# Patient Record
Sex: Female | Born: 1975
Health system: Southern US, Community
[De-identification: ages and names within clinical notes are randomized; demographics above are authoritative.]

## PROBLEM LIST (undated history)

## (undated) DIAGNOSIS — E119 Type 2 diabetes mellitus without complications: Secondary | ICD-10-CM

## (undated) DIAGNOSIS — G834 Cauda equina syndrome: Secondary | ICD-10-CM

## (undated) DIAGNOSIS — T83021A Displacement of indwelling urethral catheter, initial encounter: Secondary | ICD-10-CM

## (undated) HISTORY — PX: NO PAST SURGERIES: SHX2092

---

## 1999-06-14 ENCOUNTER — Ambulatory Visit (HOSPITAL_COMMUNITY): Admission: RE | Admit: 1999-06-14 | Discharge: 1999-06-14 | Payer: Self-pay | Admitting: *Deleted

## 1999-08-14 ENCOUNTER — Ambulatory Visit (HOSPITAL_COMMUNITY): Admission: RE | Admit: 1999-08-14 | Discharge: 1999-08-14 | Payer: Self-pay | Admitting: Obstetrics

## 1999-11-14 ENCOUNTER — Inpatient Hospital Stay (HOSPITAL_COMMUNITY): Admission: AD | Admit: 1999-11-14 | Discharge: 1999-11-14 | Payer: Self-pay | Admitting: *Deleted

## 1999-11-21 ENCOUNTER — Encounter (HOSPITAL_COMMUNITY): Admission: RE | Admit: 1999-11-21 | Discharge: 1999-12-18 | Payer: Self-pay | Admitting: Obstetrics & Gynecology

## 1999-12-17 ENCOUNTER — Encounter (INDEPENDENT_AMBULATORY_CARE_PROVIDER_SITE_OTHER): Payer: Self-pay

## 1999-12-17 ENCOUNTER — Inpatient Hospital Stay (HOSPITAL_COMMUNITY): Admission: AD | Admit: 1999-12-17 | Discharge: 1999-12-17 | Payer: Self-pay | Admitting: Obstetrics & Gynecology

## 1999-12-17 ENCOUNTER — Inpatient Hospital Stay (HOSPITAL_COMMUNITY): Admission: AD | Admit: 1999-12-17 | Discharge: 1999-12-19 | Payer: Self-pay | Admitting: *Deleted

## 1999-12-17 ENCOUNTER — Encounter: Payer: Self-pay | Admitting: *Deleted

## 1999-12-20 ENCOUNTER — Encounter: Admission: RE | Admit: 1999-12-20 | Discharge: 2000-03-19 | Payer: Self-pay | Admitting: *Deleted

## 2000-05-15 ENCOUNTER — Emergency Department (HOSPITAL_COMMUNITY): Admission: EM | Admit: 2000-05-15 | Discharge: 2000-05-15 | Payer: Self-pay | Admitting: Emergency Medicine

## 2000-11-30 ENCOUNTER — Emergency Department (HOSPITAL_COMMUNITY): Admission: EM | Admit: 2000-11-30 | Discharge: 2000-11-30 | Payer: Self-pay | Admitting: Emergency Medicine

## 2001-04-15 ENCOUNTER — Emergency Department (HOSPITAL_COMMUNITY): Admission: EM | Admit: 2001-04-15 | Discharge: 2001-04-15 | Payer: Self-pay | Admitting: Emergency Medicine

## 2002-05-25 ENCOUNTER — Ambulatory Visit (HOSPITAL_COMMUNITY): Admission: RE | Admit: 2002-05-25 | Discharge: 2002-05-25 | Payer: Self-pay | Admitting: Otolaryngology

## 2002-05-25 ENCOUNTER — Encounter: Payer: Self-pay | Admitting: Otolaryngology

## 2005-07-28 ENCOUNTER — Emergency Department (HOSPITAL_COMMUNITY): Admission: EM | Admit: 2005-07-28 | Discharge: 2005-07-28 | Payer: Self-pay | Admitting: Emergency Medicine

## 2008-01-14 ENCOUNTER — Emergency Department (HOSPITAL_COMMUNITY): Admission: EM | Admit: 2008-01-14 | Discharge: 2008-01-14 | Payer: Self-pay | Admitting: Emergency Medicine

## 2008-01-27 ENCOUNTER — Emergency Department (HOSPITAL_COMMUNITY): Admission: EM | Admit: 2008-01-27 | Discharge: 2008-01-27 | Payer: Self-pay | Admitting: Family Medicine

## 2009-12-27 ENCOUNTER — Emergency Department (HOSPITAL_COMMUNITY): Admission: EM | Admit: 2009-12-27 | Discharge: 2009-12-27 | Payer: Self-pay | Admitting: Emergency Medicine

## 2010-07-02 ENCOUNTER — Encounter: Admission: RE | Admit: 2010-07-02 | Discharge: 2010-07-10 | Payer: Self-pay | Admitting: Orthopedic Surgery

## 2015-05-22 ENCOUNTER — Other Ambulatory Visit: Payer: Self-pay | Admitting: Internal Medicine

## 2015-05-22 DIAGNOSIS — N921 Excessive and frequent menstruation with irregular cycle: Secondary | ICD-10-CM

## 2015-05-24 ENCOUNTER — Ambulatory Visit
Admission: RE | Admit: 2015-05-24 | Discharge: 2015-05-24 | Disposition: A | Discharge status: Home / Self Care | Payer: Medicaid Other | Source: Ambulatory Visit | Attending: Internal Medicine | Admitting: Internal Medicine

## 2015-05-24 ENCOUNTER — Encounter (INDEPENDENT_AMBULATORY_CARE_PROVIDER_SITE_OTHER): Payer: Self-pay

## 2015-05-24 DIAGNOSIS — N921 Excessive and frequent menstruation with irregular cycle: Secondary | ICD-10-CM

## 2015-06-04 ENCOUNTER — Encounter: Payer: Self-pay | Admitting: Obstetrics & Gynecology

## 2015-06-21 ENCOUNTER — Encounter: Payer: Medicaid Other | Admitting: Obstetrics and Gynecology

## 2015-12-08 ENCOUNTER — Emergency Department (INDEPENDENT_AMBULATORY_CARE_PROVIDER_SITE_OTHER)
Admission: EM | Admit: 2015-12-08 | Discharge: 2015-12-08 | Disposition: A | Discharge status: Home / Self Care | Payer: Medicaid Other | Source: Home / Self Care | Attending: Internal Medicine | Admitting: Internal Medicine

## 2015-12-08 ENCOUNTER — Encounter (HOSPITAL_COMMUNITY): Payer: Self-pay | Admitting: *Deleted

## 2015-12-08 DIAGNOSIS — M791 Myalgia: Secondary | ICD-10-CM | POA: Diagnosis not present

## 2015-12-08 DIAGNOSIS — R52 Pain, unspecified: Secondary | ICD-10-CM

## 2015-12-08 DIAGNOSIS — M7918 Myalgia, other site: Secondary | ICD-10-CM

## 2015-12-08 HISTORY — DX: Type 2 diabetes mellitus without complications: E11.9

## 2015-12-08 MED ORDER — NAPROXEN 500 MG PO TABS: 500.0000 mg | ORAL_TABLET | Freq: Two times a day (BID) | ORAL | Status: DC

## 2015-12-08 MED ORDER — CYCLOBENZAPRINE HCL 5 MG PO TABS: 5.0000 mg | ORAL_TABLET | Freq: Three times a day (TID) | ORAL | Status: DC | PRN
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
## 2015-12-08 NOTE — Discharge Instructions (Signed)
Prescriptions for naproxen (anti inflammatory) and flexeril (muscle relaxer) were sent to the [facility] on [address]. Recheck if not starting to improve in a few days.  Pain and stiffness in low back may take several days or longer to resolve. Ice may be helpful for pain/stiffness now; after the first 2 days, use ice or heat, whichever feels better. Physical therapy can be helpful when pain and stiffness do not resolve quickly.

## 2018-02-27 ENCOUNTER — Ambulatory Visit (HOSPITAL_COMMUNITY)
Admission: EM | Admit: 2018-02-27 | Discharge: 2018-02-27 | Disposition: A | Discharge status: Home / Self Care | Payer: Self-pay | Attending: Family Medicine | Admitting: Family Medicine

## 2018-02-27 ENCOUNTER — Other Ambulatory Visit: Payer: Self-pay

## 2018-02-27 ENCOUNTER — Encounter (HOSPITAL_COMMUNITY): Payer: Self-pay

## 2018-02-27 ENCOUNTER — Ambulatory Visit (INDEPENDENT_AMBULATORY_CARE_PROVIDER_SITE_OTHER): Payer: Self-pay

## 2018-02-27 DIAGNOSIS — R1030 Lower abdominal pain, unspecified: Secondary | ICD-10-CM

## 2018-02-27 DIAGNOSIS — R109 Unspecified abdominal pain: Secondary | ICD-10-CM

## 2018-02-27 DIAGNOSIS — Z3202 Encounter for pregnancy test, result negative: Secondary | ICD-10-CM

## 2018-02-27 LAB — POCT URINALYSIS DIP (DEVICE)
GLUCOSE, UA: NEGATIVE mg/dL
Hgb urine dipstick: NEGATIVE
KETONES UR: NEGATIVE mg/dL
Leukocytes, UA: NEGATIVE
Nitrite: NEGATIVE
PROTEIN: NEGATIVE mg/dL
SPECIFIC GRAVITY, URINE: 1.02 (ref 1.005–1.030)
UROBILINOGEN UA: 0.2 mg/dL (ref 0.0–1.0)
pH: 7 (ref 5.0–8.0)

## 2018-02-27 LAB — POCT PREGNANCY, URINE: Preg Test, Ur: NEGATIVE

## 2018-02-27 NOTE — ED Notes (Signed)
Updated on wait. 

## 2018-02-27 NOTE — ED Provider Notes (Signed)
[facility]    CSN: [id number] Arrival date & time: 02/27/18  1541     History   Chief Complaint Chief Complaint  Patient presents with  . Abdominal Pain    HPI Danielle Moreno is a 42 y.o. female.   Patient awoke this morning with abdominal pain.  There is been no nausea vomiting diarrhea or constipation.  Pain is described as lower abdomen and steady. HPI  Past Medical History:  Diagnosis Date  . Diabetes mellitus without complication (HCC)     There are no active problems to display for this patient.   History reviewed. No pertinent surgical history.  OB History   None      Home Medications    Prior to Admission medications   Medication Sig Start Date End Date Taking? Authorizing Provider  METFORMIN HCL PO Take by mouth.   Yes [provider]  naproxen (NAPROSYN) 500 MG tablet Take 1 tablet (500 mg total) by mouth 2 (two) times daily. 12/08/15  Yes Isa Rankin, MD  cyclobenzaprine (FLEXERIL) 5 MG tablet Take 1 tablet (5 mg total) by mouth 3 (three) times daily as needed for muscle spasms. 12/08/15   Isa Rankin, MD    Family History Family History  Problem Relation Age of Onset  . Diabetes Mother   . Healthy Father     Social History Social History   Tobacco Use  . Smoking status: Current Every Day Smoker  . Smokeless tobacco: Never Used  Substance Use Topics  . Alcohol use: No  . Drug use: Not on file     Allergies   Patient has no known allergies.   Review of Systems Review of Systems  Constitutional: Negative.   HENT: Negative.   Respiratory: Negative.   Cardiovascular: Negative.   Gastrointestinal: Positive for abdominal pain.  Genitourinary: Negative.   Neurological: Negative.   Psychiatric/Behavioral: Negative.      Physical Exam Triage Vital Signs ED Triage Vitals [02/27/18 1557]  Enc Vitals Group     BP (!) 156/101     Pulse Rate 92     Resp 18     Temp 99.1 F (37.3 C)   Temp src      SpO2 100 %     Weight 251 lb (113.9 kg)     Height      Head Circumference      Peak Flow      Pain Score 10     Pain Loc      Pain Edu?      Excl. in GC?    No data found.  Updated Vital Signs BP (!) 156/101   Pulse 92   Temp 99.1 F (37.3 C)   Resp 18   Wt 251 lb (113.9 kg)   LMP 01/27/2018   SpO2 100%   Visual Acuity Right Eye Distance:   Left Eye Distance:   Bilateral Distance:    Right Eye Near:   Left Eye Near:    Bilateral Near:     Physical Exam  Constitutional: She appears well-developed and well-nourished.  Cardiovascular: Normal rate and regular rhythm.  Pulmonary/Chest: Effort normal and breath sounds normal.  Abdominal: Bowel sounds are decreased. There is no splenomegaly or hepatomegaly. There is tenderness. There is no rigidity, no rebound, no guarding and no CVA tenderness.     UC Treatments / Results  Labs (all labs ordered are listed, but only abnormal results are displayed) Labs Reviewed  POCT URINALYSIS  DIP (DEVICE) - Abnormal; Notable for the following components:      Result Value   Bilirubin Urine MODERATE (*)    All other components within normal limits  POCT PREGNANCY, URINE    EKG None Radiology No results found.  Procedures Procedures (including critical care time)  Medications Ordered in UC Medications - No data to display   Initial Impression / Assessment and Plan / UC Course  I have reviewed the triage vital signs and the nursing notes.  Pertinent labs & imaging results that were available during my care of the patient were reviewed by me and considered in my medical decision making (see chart for details).     Nonspecific abdominal pain.  Abdominal x-ray shows large amounts of stool throughout the colon.  I believe if we could get her bowel cleaned out that her pain will improve we will begin a MiraLAX purge.  Instructions given  Final Clinical Impressions(s) / UC Diagnoses   Final diagnoses:  None     ED Discharge Orders    None       Controlled Substance Prescriptions [place] Controlled Substance Registry consulted? No   Frederica Kuster, MD 02/27/18 [phone]

## 2018-02-27 NOTE — ED Triage Notes (Signed)
Pt reports pain in her lower stomach radiating to her back. Denies any urinary symptoms.

## 2018-02-28 ENCOUNTER — Telehealth (HOSPITAL_COMMUNITY): Payer: Self-pay | Admitting: Family Medicine

## 2018-02-28 NOTE — Telephone Encounter (Signed)
Provided work note

## 2018-10-03 ENCOUNTER — Ambulatory Visit (HOSPITAL_COMMUNITY)
Admission: EM | Admit: 2018-10-03 | Discharge: 2018-10-03 | Disposition: A | Discharge status: Home / Self Care | Payer: Medicaid Other | Attending: Family Medicine | Admitting: Family Medicine

## 2018-10-03 ENCOUNTER — Encounter (HOSPITAL_COMMUNITY): Payer: Self-pay | Admitting: Emergency Medicine

## 2018-10-03 ENCOUNTER — Other Ambulatory Visit: Payer: Self-pay

## 2018-10-03 DIAGNOSIS — M5431 Sciatica, right side: Secondary | ICD-10-CM

## 2018-10-03 MED ORDER — HYDROCODONE-ACETAMINOPHEN 5-325 MG PO TABS: 1.0000 | ORAL_TABLET | Freq: Four times a day (QID) | ORAL | 0 refills | Status: DC | PRN

## 2018-10-03 MED ORDER — PREDNISONE 20 MG PO TABS: ORAL_TABLET | ORAL | 0 refills | Status: DC

## 2018-10-03 MED ORDER — METFORMIN HCL 500 MG PO TABS: 500.0000 mg | ORAL_TABLET | Freq: Two times a day (BID) | ORAL | 2 refills | Status: DC

## 2018-10-03 NOTE — ED Triage Notes (Signed)
Lower back pain for 2 weeks.  Right hip started hurting one week ago.  Denies fall.

## 2018-10-03 NOTE — ED Provider Notes (Signed)
[facility]    CSN: [id number] Arrival date & time: 10/03/18  1107     History   Chief Complaint Chief Complaint  Patient presents with  . Back Pain    HPI Danielle Moreno is a 42 y.o. female.   Lower back pain for 2 weeks.  Right hip started hurting one week ago.  Denies fall.    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  She has no fever, no weakness in the leg, no numbness in the leg.  Back pain is on the right side and radiates down as far as the right knee     Past Medical History:  Diagnosis Date  . Diabetes mellitus without complication (HCC)     There are no active problems to display for this patient.   History reviewed. No pertinent surgical history.  OB History   None      Home Medications    Prior to Admission medications   Medication Sig Start Date End Date Taking? Authorizing Provider  ibuprofen (ADVIL,MOTRIN) 800 MG tablet Take 800 mg by mouth every 8 (eight) hours as needed.   Yes [provider]  NON FORMULARY    Yes [provider]  HYDROcodone-acetaminophen (NORCO) 5-325 MG tablet Take 1 tablet by mouth every 6 (six) hours as needed for moderate pain. 10/03/18   Elvina Sidle, MD  metFORMIN (GLUCOPHAGE) 500 MG tablet Take 1 tablet (500 mg total) by mouth 2 (two) times daily with a meal. 10/03/18   Elvina Sidle, MD  predniSONE (DELTASONE) 20 MG tablet Two daily with food 10/03/18   Elvina Sidle, MD    Family History Family History  Problem Relation Age of Onset  . Diabetes Mother   . Healthy Father     Social History Social History   Tobacco Use  . Smoking status: Current Every Day Smoker  . Smokeless tobacco: Never Used  Substance Use Topics  . Alcohol use: No  . Drug use: Never     Allergies   Patient has no known allergies.   Review of Systems Review of Systems  Musculoskeletal: Positive for back pain.  All other  systems reviewed and are negative.    Physical Exam Triage Vital Signs ED Triage Vitals  Enc Vitals Group     BP 10/03/18 1143 (!) 166/83     Pulse Rate 10/03/18 1143 80     Resp 10/03/18 1143 18     Temp 10/03/18 1143 97.8 F (36.6 C)     Temp Source 10/03/18 1143 Oral     SpO2 10/03/18 1143 100 %     Weight --      Height --      Head Circumference --      Peak Flow --      Pain Score 10/03/18 1139 10     Pain Loc --      Pain Edu? --      Excl. in GC? --    No data found.  Updated Vital Signs BP (!) 166/83 (BP Location: Right Arm) Comment (BP Location): large cuff  Pulse 80   Temp 97.8 F (36.6 C) (Oral)   Resp 18   LMP 10/01/2018   SpO2 100%    Physical Exam  Constitutional: She appears well-developed and well-nourished.  HENT:  Right Ear: External ear normal.  Left Ear: External ear normal.  Eyes: Conjunctivae are normal.  Neck: Normal range of  motion. Neck supple.  Pulmonary/Chest: Effort normal.  Musculoskeletal:  Positive straight leg raising on the right  Palpation of the right back and right hip areas are tender.  Neurological: She is alert.  Skin: Skin is warm and dry.  Psychiatric: She has a normal mood and affect.  Nursing note and vitals reviewed.    UC Treatments / Results  Labs (all labs ordered are listed, but only abnormal results are displayed) Labs Reviewed - No data to display  EKG None  Radiology No results found.  Procedures Procedures (including critical care time)  Medications Ordered in UC Medications - No data to display  Initial Impression / Assessment and Plan / UC Course  I have reviewed the triage vital signs and the nursing notes.  Pertinent labs & imaging results that were available during my care of the patient were reviewed by me and considered in my medical decision making (see chart for details).    Final Clinical Impressions(s) / UC Diagnoses   Final diagnoses:  Sciatica of right side      Discharge Instructions     If your pain is not improving in the next 2 days, please return for reevaluation.    ED Prescriptions    Medication Sig Dispense Auth. Provider   metFORMIN (GLUCOPHAGE) 500 MG tablet Take 1 tablet (500 mg total) by mouth 2 (two) times daily with a meal. 60 tablet Elvina Sidle, MD   predniSONE (DELTASONE) 20 MG tablet Two daily with food 10 tablet Elvina Sidle, MD   HYDROcodone-acetaminophen (NORCO) 5-325 MG tablet Take 1 tablet by mouth every 6 (six) hours as needed for moderate pain. 12 tablet Elvina Sidle, MD     Controlled Substance Prescriptions [place] Controlled Substance Registry consulted? Not Applicable   Elvina Sidle, MD 10/03/18 1203

## 2018-10-03 NOTE — Discharge Instructions (Addendum)
If your pain is not improving in the next 2 days, please return for reevaluation.

## 2018-10-05 ENCOUNTER — Encounter (HOSPITAL_COMMUNITY): Payer: Self-pay | Admitting: Emergency Medicine

## 2018-10-05 ENCOUNTER — Ambulatory Visit (HOSPITAL_COMMUNITY)
Admission: EM | Admit: 2018-10-05 | Discharge: 2018-10-05 | Disposition: A | Discharge status: Home / Self Care | Payer: Self-pay | Attending: Family Medicine | Admitting: Family Medicine

## 2018-10-05 DIAGNOSIS — M5441 Lumbago with sciatica, right side: Secondary | ICD-10-CM

## 2018-10-05 MED ORDER — CYCLOBENZAPRINE HCL 5 MG PO TABS: 10.0000 mg | ORAL_TABLET | Freq: Every day | ORAL | 0 refills | Status: DC

## 2018-10-05 NOTE — ED Provider Notes (Signed)
[facility]   [id number] 10/05/18 Arrival Time: 6283  CC: Back PAIN  SUBJECTIVE: History from: patient. Danielle Moreno is a 42 y.o. female complains of right sided low back pain that began 2 weeks ago.  Denies a precipitating event or specific injury.  Localizes the pain to the low back.  Describes the pain as constant and "20"/10.  Was seen at [facility] 2 days ago and treated with norco and prednisone.  Reports minimal relief with treatment.  Symptoms are made worse with standing.  Denies similar symptoms in the past.  Complains of numbness in right leg.  Denies fever, chills, erythema, ecchymosis, effusion, weakness, saddle paresthesias, or loss of bowel or bladder habits.   ROS: As per HPI.  Past Medical History:  Diagnosis Date  . Diabetes mellitus without complication (HCC)    History reviewed. No pertinent surgical history. No Known Allergies No current facility-administered medications on file prior to encounter.    Current Outpatient Medications on File Prior to Encounter  Medication Sig Dispense Refill  . HYDROcodone-acetaminophen (NORCO) 5-325 MG tablet Take 1 tablet by mouth every 6 (six) hours as needed for moderate pain. 12 tablet 0  . ibuprofen (ADVIL,MOTRIN) 800 MG tablet Take 800 mg by mouth every 8 (eight) hours as needed.    . metFORMIN (GLUCOPHAGE) 500 MG tablet Take 1 tablet (500 mg total) by mouth 2 (two) times daily with a meal. 60 tablet 2  . NON FORMULARY     . predniSONE (DELTASONE) 20 MG tablet Two daily with food 10 tablet 0   Social History   Socioeconomic History  . Marital status: Single    Spouse name: Not on file  . Number of children: Not on file  . Years of education: Not on file  . Highest education level: Not on file  Occupational History  . Not on file  Social Needs  . Financial resource strain: Not on file  . Food insecurity:    Worry: Not on file    Inability: Not on file  . Transportation needs:    Medical: Not on file   Non-medical: Not on file  Tobacco Use  . Smoking status: Current Every Day Smoker  . Smokeless tobacco: Never Used  Substance and Sexual Activity  . Alcohol use: No  . Drug use: Never  . Sexual activity: Not on file  Lifestyle  . Physical activity:    Days per week: Not on file    Minutes per session: Not on file  . Stress: Not on file  Relationships  . Social connections:    Talks on phone: Not on file    Gets together: Not on file    Attends religious service: Not on file    Active member of club or organization: Not on file    Attends meetings of clubs or organizations: Not on file    Relationship status: Not on file  . Intimate partner violence:    Fear of current or ex partner: Not on file    Emotionally abused: Not on file    Physically abused: Not on file    Forced sexual activity: Not on file  Other Topics Concern  . Not on file  Social History Narrative  . Not on file   Family History  Problem Relation Age of Onset  . Diabetes Mother   . Healthy Father     OBJECTIVE:  Vitals:   10/05/18 1058  BP: (!) 152/94  Pulse: 79  Resp: 18  Temp: 97.7 F (36.5 C)  TempSrc: Oral  SpO2: 100%    General appearance: AOx3; in no acute distress.  Head: NCAT Lungs: CTA bilaterally Heart: RRR.  Clear S1 and S2 without murmur, gallops, or rubs.  Radial pulses 2+ bilaterally. Musculoskeletal: Back Inspection: Skin warm, dry, clear and intact without obvious erythema, effusion, or ecchymosis.  Palpation: Diffusely tender to palpation about the low back; and bilateral lower paravertebral muscles.   ROM: LROM Strength: 5/5 shld abduction, 5/5 shld adduction, 5/5 elbow flexion, 5/5 elbow extension, 5/5 grip strength, 5/5 hip flexion, 5/5 knee abduction, 5/5 knee adduction, 5/5 knee flexion, 5/5 knee extension, 5/5 dorsiflexion, 5/5 plantar flexion - SLR Skin: warm and dry Neurologic: Ambulates with minimal difficulty; Sensation intact about the upper/ lower  extremities Psychological: alert and cooperative; normal mood and affect  ASSESSMENT & PLAN:  1. Acute bilateral low back pain with right-sided sciatica    Meds ordered this encounter  Medications  . cyclobenzaprine (FLEXERIL) 5 MG tablet    Sig: Take 2 tablets (10 mg total) by mouth at bedtime.    Dispense:  12 tablet    Refill:  0    Order Specific Question:   Supervising Provider    Answer:   Isa Rankin [[id number]]   Continue conservative management of rest, ice, heat, and gentle stretches Continue with prednisone as prescribed.   Take cyclobenzaprine at nighttime for symptomatic relief. Avoid driving or operating heavy machinery while using medication. Follow up with orthopedist for further evaluation and management of low back pain Return or go to the ER if you have any new or worsening symptoms (fever, chills, chest pain, abdominal pain, changes in bowel or bladder habits, pain radiating into lower legs, etc...)   Reviewed expectations re: course of current medical issues. Questions answered. Outlined signs and symptoms indicating need for more acute intervention. Patient verbalized understanding. After Visit Summary given.    Rennis Harding, PA-C 10/05/18 1351

## 2018-10-05 NOTE — Discharge Instructions (Signed)
Continue conservative management of rest, ice, heat, and gentle stretches Continue with prednisone as prescribed.   Take cyclobenzaprine at nighttime for symptomatic relief. Avoid driving or operating heavy machinery while using medication. Follow up with orthopedist for further evaluation and management of low back pain Return or go to the ER if you have any new or worsening symptoms (fever, chills, chest pain, abdominal pain, changes in bowel or bladder habits, pain radiating into lower legs, etc...)

## 2018-10-05 NOTE — ED Triage Notes (Signed)
Pt here for right sided back and hip pain; pt seen here recently for same

## 2018-10-07 ENCOUNTER — Emergency Department (HOSPITAL_COMMUNITY)
Admission: EM | Admit: 2018-10-07 | Discharge: 2018-10-07 | Disposition: A | Discharge status: Home / Self Care | Payer: Medicaid Other | Attending: Emergency Medicine | Admitting: Emergency Medicine

## 2018-10-07 ENCOUNTER — Encounter (HOSPITAL_COMMUNITY): Payer: Self-pay

## 2018-10-07 DIAGNOSIS — F17210 Nicotine dependence, cigarettes, uncomplicated: Secondary | ICD-10-CM | POA: Insufficient documentation

## 2018-10-07 DIAGNOSIS — Z79899 Other long term (current) drug therapy: Secondary | ICD-10-CM | POA: Insufficient documentation

## 2018-10-07 DIAGNOSIS — M5431 Sciatica, right side: Secondary | ICD-10-CM | POA: Insufficient documentation

## 2018-10-07 DIAGNOSIS — E119 Type 2 diabetes mellitus without complications: Secondary | ICD-10-CM | POA: Insufficient documentation

## 2018-10-07 MED ORDER — IBUPROFEN 800 MG PO TABS
800.0000 mg | ORAL_TABLET | Freq: Once | ORAL | Status: AC
  Administered 2018-10-07: 800 mg via ORAL
  Filled 2018-10-07: qty 1

## 2018-10-07 MED ORDER — IBUPROFEN 800 MG PO TABS: 800.0000 mg | ORAL_TABLET | Freq: Three times a day (TID) | ORAL | 0 refills | Status: DC | PRN

## 2018-10-07 NOTE — ED Triage Notes (Signed)
Pt reports back pain that extends down into right leg. Pt reports that she has been seen at [facility] for the same. Pt reports tenderness in the rt buttocks. Pt reports trying Motrin OTC with no relief.

## 2018-10-07 NOTE — ED Provider Notes (Signed)
[facility]-EMERGENCY DEPT Provider Note   CSN: [id number] Arrival date & time: 10/07/18  8453     History   Chief Complaint Chief Complaint  Patient presents with  . Back Pain  . Hip Pain    HPI Danielle Moreno is a 42 y.o. female.  The history is provided by the patient.  Back Pain   This is a new problem. The current episode started more than 1 week ago. The problem occurs daily. The problem has not changed since onset.The pain is associated with no known injury. The pain is present in the lumbar spine and sacro-iliac joint. The quality of the pain is described as aching. The pain does not radiate. The pain is at a severity of 4/10. The pain is moderate. The symptoms are aggravated by twisting and certain positions. Pertinent negatives include no chest pain, no fever, no numbness, no weight loss, no headaches, no abdominal pain, no abdominal swelling, no bowel incontinence, no perianal numbness, no bladder incontinence, no dysuria, no pelvic pain, no leg pain, no paresthesias, no paresis and no tingling. She has tried NSAIDs, muscle relaxants and analgesics for the symptoms. The treatment provided mild relief.    Past Medical History:  Diagnosis Date  . Diabetes mellitus without complication (HCC)     There are no active problems to display for this patient.   History reviewed. No pertinent surgical history.   OB History   None      Home Medications    Prior to Admission medications   Medication Sig Start Date End Date Taking? Authorizing Provider  cyclobenzaprine (FLEXERIL) 5 MG tablet Take 2 tablets (10 mg total) by mouth at bedtime. 10/05/18   Wurst, [place], PA-C  HYDROcodone-acetaminophen (NORCO) 5-325 MG tablet Take 1 tablet by mouth every 6 (six) hours as needed for moderate pain. 10/03/18   Elvina Sidle, MD  ibuprofen (ADVIL,MOTRIN) 800 MG tablet Take 1 tablet (800 mg total) by mouth every 8 (eight) hours as needed for up to 30  doses. 10/07/18   Shakendra Griffeth, DO  metFORMIN (GLUCOPHAGE) 500 MG tablet Take 1 tablet (500 mg total) by mouth 2 (two) times daily with a meal. 10/03/18   Elvina Sidle, MD  NON FORMULARY     [provider]  predniSONE (DELTASONE) 20 MG tablet Two daily with food 10/03/18   Elvina Sidle, MD    Family History Family History  Problem Relation Age of Onset  . Diabetes Mother   . Healthy Father     Social History Social History   Tobacco Use  . Smoking status: Current Every Day Smoker  . Smokeless tobacco: Never Used  Substance Use Topics  . Alcohol use: No  . Drug use: Never     Allergies   Patient has no known allergies.   Review of Systems Review of Systems  Constitutional: Negative for chills, fever and weight loss.  HENT: Negative for ear pain and sore throat.   Eyes: Negative for pain and visual disturbance.  Respiratory: Negative for cough and shortness of breath.   Cardiovascular: Negative for chest pain and palpitations.  Gastrointestinal: Negative for abdominal pain, bowel incontinence and vomiting.  Genitourinary: Negative for bladder incontinence, dysuria, hematuria and pelvic pain.  Musculoskeletal: Positive for arthralgias and back pain. Negative for joint swelling, myalgias, neck pain and neck stiffness.  Skin: Negative for color change and rash.  Neurological: Negative for tingling, seizures, syncope, numbness, headaches and paresthesias.  All other systems reviewed and are  negative.    Physical Exam Updated Vital Signs  ED Triage Vitals [10/07/18 0953]  Enc Vitals Group     BP (!) 160/128     Pulse Rate 89     Resp 16     Temp 98.3 F (36.8 C)     Temp Source Oral     SpO2 99 %     Weight 250 lb (113.4 kg)     Height 5\' 8"  (1.727 m)     Head Circumference      Peak Flow      Pain Score      Pain Loc      Pain Edu?      Excl. in GC?     Physical Exam  Constitutional: She is oriented to person, place, and time. She  appears well-developed and well-nourished. No distress.  HENT:  Head: Normocephalic and atraumatic.  Eyes: Pupils are equal, round, and reactive to light. Conjunctivae and EOM are normal.  Neck: Neck supple.  Cardiovascular: Normal rate, regular rhythm, normal heart sounds and intact distal pulses.  No murmur heard. Pulmonary/Chest: Effort normal and breath sounds normal. No respiratory distress.  Abdominal: Soft. There is no tenderness.  Musculoskeletal: Normal range of motion. She exhibits tenderness (TTP to right piriformis muscle). She exhibits no edema.  No midline spinal pain   Neurological: She is alert and oriented to person, place, and time. No cranial nerve deficit or sensory deficit. She exhibits normal muscle tone. Coordination normal.  5+/5 strength, normal sensation, normal gait  Skin: Skin is warm and dry. Capillary refill takes less than 2 seconds.  Psychiatric: She has a normal mood and affect.  Nursing note and vitals reviewed.    ED Treatments / Results  Labs (all labs ordered are listed, but only abnormal results are displayed) Labs Reviewed - No data to display  EKG None  Radiology No results found.  Procedures Procedures (including critical care time)  Medications Ordered in ED Medications  ibuprofen (ADVIL,MOTRIN) tablet 800 mg (has no administration in time range)     Initial Impression / Assessment and Plan / ED Course  I have reviewed the triage vital signs and the nursing notes.  Pertinent labs & imaging results that were available during my care of the patient were reviewed by me and considered in my medical decision making (see chart for details).     Danielle Moreno is a 42 year old female history of diabetes who presents to the ED with right-sided lower back pain.  Patient with normal vitals.  Patient seen by urgent care several days ago was started on narcotic pain medicine and prednisone without much relief.  Patient has manual labor  job.  Denies any trauma.  Patient has no symptoms suggestive of cauda equina.  No loss of bowel or bladder.  No weakness.  Pain is mostly in the right gluteal area with shooting pain down the leg.  She is tender in the right piriformis area on exam.  Normal neurological exam.  No midline spinal tenderness.  History and physical is consistent with sciatica.  Recommend anti-inflammatory treatment with Motrin.  Recommend discontinuing narcotic pain medicine.  Given home exercises and recommend close follow-up with primary care doctor.  May need physical therapy.  Discharged from ED in good condition given return precautions.  This chart was dictated using voice recognition software.  Despite best efforts to proofread,  errors can occur which can change the documentation meaning.   Final Clinical Impressions(s) / ED  Diagnoses   Final diagnoses:  Sciatica of right side    ED Discharge Orders         Ordered    ibuprofen (ADVIL,MOTRIN) 800 MG tablet  Every 8 hours PRN     10/07/18 0951           Virgina Norfolk, DO 10/07/18 [phone]

## 2018-10-07 NOTE — ED Notes (Signed)
Bed: [facility] Expected date:  Expected time:  Means of arrival:  Comments: No bed 

## 2018-10-13 NOTE — Progress Notes (Signed)
Patient ID: Danielle Moreno, female   DOB: 07-15-1976, 42 y.o.   MRN: [id number]      Danielle Moreno, is a 42 y.o. female  XTA:[id number]  [id number]  DOB - 1976/11/03  Subjective:  Chief Complaint and HPI: Danielle Moreno is a 42 y.o. female here today to establish care and for a follow up visit After being seen in the ED 10/07/2018 for back pain.  Pain has not improved.  She has had prednisone, high dose ibuprofen, flexeril, and even opiates w/o pain relief.  Pain started acutely about 3 weeks ago.  NKI.  Pain is in mi lower back and radiates into R leg.  No paresthesias or weakness.  No urinary s/sx.  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   From ED note: Back Pain   This is a new problem. The current episode started more than 1 week ago. The problem occurs daily. The problem has not changed since onset.The pain is associated with no known injury. The pain is present in the lumbar spine and sacro-iliac joint. The quality of the pain is described as aching. The pain does not radiate. The pain is at a severity of 4/10. The pain is moderate. The symptoms are aggravated by twisting and certain positions. Pertinent negatives include no chest pain, no fever, no numbness, no weight loss, no headaches, no abdominal pain, no abdominal swelling, no bowel incontinence, no perianal numbness, no bladder incontinence, no dysuria, no pelvic pain, no leg pain, no paresthesias, no paresis and no tingling. She has tried NSAIDs, muscle relaxants and analgesics for the symptoms. The treatment provided mild relief.   From A/P: Danielle Moreno is a 42 year old female history of diabetes who presents to the ED with right-sided lower back pain.  Patient with normal vitals.  Patient seen by urgent care several days ago was started on narcotic pain medicine and prednisone without much relief.  Patient has manual labor job.  Denies any trauma.  Patient has no symptoms suggestive of cauda  equina.  No loss of bowel or bladder.  No weakness.  Pain is mostly in the right gluteal area with shooting pain down the leg.  She is tender in the right piriformis area on exam.  Normal neurological exam.  No midline spinal tenderness.  History and physical is consistent with sciatica.  Recommend anti-inflammatory treatment with Motrin.  Recommend discontinuing narcotic pain medicine.  Given home exercises and recommend close follow-up with primary care doctor.  May need physical therapy.  Discharged from ED in good condition given return precautions.  ED/Hospital notes reviewed.   Social History:  Works in a kitchen  ROS:   Constitutional:  No f/c, No night sweats, No unexplained weight loss. EENT:  No vision changes, No blurry vision, No hearing changes. No mouth, throat, or ear problems.  Respiratory: No cough, No SOB Cardiac: No CP, no palpitations GI:  No abd pain, No N/V/D. GU: No Urinary s/sx Musculoskeletal: + low back and R leg pain Neuro: No headache, no dizziness, no motor weakness.  Skin: No rash Endocrine:  No polydipsia. No polyuria.  Psych: Denies SI/HI  No problems updated.  ALLERGIES: No Known Allergies  PAST MEDICAL HISTORY: Past Medical History:  Diagnosis Date  . Diabetes mellitus without complication (HCC)     MEDICATIONS AT HOME: Prior to Admission medications   Medication Sig Start Date End Date Taking? Authorizing Provider  metFORMIN (GLUCOPHAGE) 500 MG tablet Take 1 tablet (500 mg total) by  mouth 2 (two) times daily with a meal. 10/20/18  Yes Calven Gilkes, Marzella Schlein, PA-C  NON FORMULARY    Yes [provider]  diclofenac sodium (VOLTAREN) 1 % GEL Apply 2 g topically 4 (four) times daily. 10/20/18   Anders Simmonds, PA-C  methocarbamol (ROBAXIN) 500 MG tablet Take 2 tablets (1,000 mg total) by mouth 3 (three) times daily. X 10 days then prn muscle spasm 10/20/18   Georgian Co M, PA-C  naproxen (NAPROSYN) 500 MG tablet Take 1 tablet (500 mg total) by  mouth 2 (two) times daily with a meal. X 10days then prn pain 10/20/18   Anders Simmonds, PA-C     Objective:  EXAM:   Vitals:   10/20/18 1454  BP: (!) 146/90  Pulse: 88  Resp: 18  Temp: 98.7 F (37.1 C)  TempSrc: Oral  SpO2: 99%  Weight: 247 lb (112 kg)  Height: 5\' 8"  (1.727 m)    General appearance : A&OX3. NAD. Non-toxic-appearing HEENT: Atraumatic and Normocephalic.  PERRLA. EOM intact.  Neck: supple, no JVD. No cervical lymphadenopathy. No thyromegaly Chest/Lungs:  Breathing-non-labored, Good air entry bilaterally, breath sounds normal without rales, rhonchi, or wheezing  CVS: S1 S2 regular, no murmurs, gallops, rubs  Back:  ROM ~70% of normal.  Labored movements.  Small limp favoring L side.  Sensory=B.  DTR=B.  Neg SLR B.   Extremities: Bilateral Lower Ext shows no edema, both legs are warm to touch with = pulse throughout Neurology:  CN II-XII grossly intact, Non focal.   Psych:  TP linear. J/I WNL. Normal speech. Appropriate eye contact and affect.  Skin:  No Rash  Data Review Lab Results  Component Value Date   HGBA1C 5.8 10/20/2018     Assessment & Plan   1. Acute right-sided low back pain with right-sided sciatica No red flags but concern due to lack of pain relief- - ketorolac (TORADOL) injection 60 mg - naproxen (NAPROSYN) 500 MG tablet; Take 1 tablet (500 mg total) by mouth 2 (two) times daily with a meal. X 10days then prn pain  Dispense: 60 tablet; Refill: 1 - methocarbamol (ROBAXIN) 500 MG tablet; Take 2 tablets (1,000 mg total) by mouth 3 (three) times daily. X 10 days then prn muscle spasm  Dispense: 90 tablet; Refill: 0 - diclofenac sodium (VOLTAREN) 1 % GEL; Apply 2 g topically 4 (four) times daily.  Dispense: 100 g; Refill: 3 - DG Lumbar Spine Complete; Future -consider MRI?  2. Type 2 diabetes mellitus with hyperglycemia, without long-term current use of insulin (HCC) Controlled-will hold off on labs until January since she will have insurance  as of then.   - Glucose (CBG) - HgB A1c - metFORMIN (GLUCOPHAGE) 500 MG tablet; Take 1 tablet (500 mg total) by mouth 2 (two) times daily with a meal.  Dispense: 60 tablet; Refill: 2  3. Encounter for examination following treatment at hospital No changes.     Patient have been counseled extensively about nutrition and exercise  Return in about 1 month (around 11/20/2018) for assign PCP-recheck back pain.  The patient was given clear instructions to go to ER or return to medical center if symptoms don't improve, worsen or new problems develop. The patient verbalized understanding. The patient was told to call to get lab results if they haven't heard anything in the next week.     Georgian Co, PA-C [facility] [place], [place] [phone]   10/20/2018, 3:50 PM

## 2018-10-20 ENCOUNTER — Ambulatory Visit: Payer: Self-pay | Attending: Family Medicine | Admitting: Physician Assistant

## 2018-10-20 VITALS — BP 146/90 | HR 88 | Temp 98.7°F | Resp 18 | Ht 68.0 in | Wt 247.0 lb

## 2018-10-20 DIAGNOSIS — Z09 Encounter for follow-up examination after completed treatment for conditions other than malignant neoplasm: Secondary | ICD-10-CM

## 2018-10-20 DIAGNOSIS — M5441 Lumbago with sciatica, right side: Secondary | ICD-10-CM | POA: Insufficient documentation

## 2018-10-20 DIAGNOSIS — Z791 Long term (current) use of non-steroidal anti-inflammatories (NSAID): Secondary | ICD-10-CM | POA: Insufficient documentation

## 2018-10-20 DIAGNOSIS — Z7984 Long term (current) use of oral hypoglycemic drugs: Secondary | ICD-10-CM | POA: Insufficient documentation

## 2018-10-20 DIAGNOSIS — E1165 Type 2 diabetes mellitus with hyperglycemia: Secondary | ICD-10-CM | POA: Insufficient documentation

## 2018-10-20 DIAGNOSIS — M47896 Other spondylosis, lumbar region: Secondary | ICD-10-CM | POA: Insufficient documentation

## 2018-10-20 LAB — POCT GLYCOSYLATED HEMOGLOBIN (HGB A1C): HBA1C, POC (PREDIABETIC RANGE): 5.8 % (ref 5.7–6.4)

## 2018-10-20 LAB — GLUCOSE, POCT (MANUAL RESULT ENTRY): POC GLUCOSE: 101 mg/dL — AB (ref 70–99)

## 2018-10-20 MED ORDER — DICLOFENAC SODIUM 1 % TD GEL: 2.0000 g | Freq: Four times a day (QID) | TRANSDERMAL | 3 refills | Status: DC

## 2018-10-20 MED ORDER — NAPROXEN 500 MG PO TABS
500.0000 mg | ORAL_TABLET | Freq: Two times a day (BID) | ORAL | 1 refills | Status: DC
Start: 2018-10-20 — End: 2018-12-31

## 2018-10-20 MED ORDER — KETOROLAC TROMETHAMINE 60 MG/2ML IM SOLN
60.0000 mg | Freq: Once | INTRAMUSCULAR | Status: AC
  Administered 2018-10-20: 60 mg via INTRAMUSCULAR

## 2018-10-20 MED ORDER — METFORMIN HCL 500 MG PO TABS: 500.0000 mg | ORAL_TABLET | Freq: Two times a day (BID) | ORAL | 2 refills | Status: AC

## 2018-10-20 MED ORDER — METHOCARBAMOL 500 MG PO TABS: 1000.0000 mg | ORAL_TABLET | Freq: Three times a day (TID) | ORAL | 0 refills | Status: DC

## 2018-10-20 MED FILL — METHOCARBAMOL 500 MG TABS: 500 | 15 days supply | Qty: 90 | Fill #0

## 2018-10-20 MED FILL — metFORMIN HCL 500 MG TABS: 500 | 30 days supply | Qty: 60 | Fill #0

## 2018-10-20 MED FILL — DICLOFENAC SODIUM 1% GEL: 1 | 12 days supply | Qty: 100 | Fill #0

## 2018-10-20 MED FILL — NAPROXEN 500 MG TABLET: 500 | 30 days supply | Qty: 60 | Fill #0

## 2018-10-21 ENCOUNTER — Ambulatory Visit (HOSPITAL_COMMUNITY)
Admission: RE | Admit: 2018-10-21 | Discharge: 2018-10-21 | Disposition: A | Discharge status: Home / Self Care | Payer: Medicaid Other | Source: Ambulatory Visit | Attending: Physician Assistant | Admitting: Physician Assistant

## 2018-10-21 ENCOUNTER — Encounter (HOSPITAL_COMMUNITY): Payer: Self-pay

## 2018-10-21 DIAGNOSIS — M5441 Lumbago with sciatica, right side: Secondary | ICD-10-CM | POA: Insufficient documentation

## 2018-10-25 ENCOUNTER — Telehealth: Payer: Self-pay | Admitting: *Deleted

## 2018-10-25 NOTE — Telephone Encounter (Signed)
Patient verified DOB Patient is aware of arthritis being noted and no abnormalities. Patient advised to use medications as prescribed and to follow up as planned. No further questions.

## 2018-10-25 NOTE — Telephone Encounter (Signed)
-----   Message from Anders Simmonds, [place] sent at 10/23/2018 10:57 AM EST ----- Please call patient.  Xray showed some arthritis but no other abnormalities.  Follow-up as planned.  Thanks, Georgian Co, PA-C

## 2018-11-22 ENCOUNTER — Encounter: Payer: Self-pay | Admitting: Family Medicine

## 2018-11-22 ENCOUNTER — Ambulatory Visit: Payer: Self-pay | Attending: Family Medicine | Admitting: Family Medicine

## 2018-11-22 VITALS — BP 138/90 | HR 68 | Temp 98.5°F | Resp 18 | Ht 68.0 in | Wt 242.0 lb

## 2018-11-22 DIAGNOSIS — E119 Type 2 diabetes mellitus without complications: Secondary | ICD-10-CM | POA: Insufficient documentation

## 2018-11-22 DIAGNOSIS — Z1239 Encounter for other screening for malignant neoplasm of breast: Secondary | ICD-10-CM

## 2018-11-22 DIAGNOSIS — M47816 Spondylosis without myelopathy or radiculopathy, lumbar region: Secondary | ICD-10-CM | POA: Insufficient documentation

## 2018-11-22 DIAGNOSIS — F17200 Nicotine dependence, unspecified, uncomplicated: Secondary | ICD-10-CM | POA: Insufficient documentation

## 2018-11-22 DIAGNOSIS — Z79899 Other long term (current) drug therapy: Secondary | ICD-10-CM | POA: Insufficient documentation

## 2018-11-22 DIAGNOSIS — Z7984 Long term (current) use of oral hypoglycemic drugs: Secondary | ICD-10-CM | POA: Insufficient documentation

## 2018-11-22 DIAGNOSIS — Z6836 Body mass index (BMI) 36.0-36.9, adult: Secondary | ICD-10-CM

## 2018-11-22 DIAGNOSIS — E669 Obesity, unspecified: Secondary | ICD-10-CM

## 2018-11-22 DIAGNOSIS — Z833 Family history of diabetes mellitus: Secondary | ICD-10-CM | POA: Insufficient documentation

## 2018-11-22 DIAGNOSIS — Z791 Long term (current) use of non-steroidal anti-inflammatories (NSAID): Secondary | ICD-10-CM | POA: Insufficient documentation

## 2018-11-22 LAB — POCT GLYCOSYLATED HEMOGLOBIN (HGB A1C): Hemoglobin A1C: 5.4 % (ref 4.0–5.6)

## 2018-11-22 MED FILL — metFORMIN HCL 500 MG TABS: 500 | 30 days supply | Qty: 60 | Fill #1

## 2018-11-22 MED FILL — NAPROXEN 500 MG TABLET: 500 | 30 days supply | Qty: 60 | Fill #1

## 2018-11-22 NOTE — Progress Notes (Signed)
Subjective:    Patient ID: Danielle Moreno, female    DOB: 04-Mar-1976, 43 y.o.   MRN: [id number]  HPI       43 year old female with diabetes who is status post new patient visit on 10/20/2018 to establish care after an ED visit for back pain with radiation on 10/07/2018.  Patient's most recent hemoglobin A1c was 5.8 on 10/20/2018.  Patient had x-ray of the lumbar spine done on 10/21/2018 which showed diffuse multilevel degenerative changes but no acute bony abnormalities.  Patient reports that she has had improvement in her right-sided low back pain.  Patient is no longer having any radiation of pain down her right leg.  Patient also states that her pain is now about a 3-4 on a 0-to-10 scale in the right mid to low back.  Patient was prescribed naproxen, Robaxin and Voltaren gel at her last visit on review of note.  Patient did have x-ray of her lumbar spine that was ordered at her last visit.  Patient also states that the injection she was given at her last visit (toradol) also helped.  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      Patient reports that she is taking the metformin prescribed for her diabetes.  Patient denies any increased thirst, no urinary frequency and no blurred vision.  Patient denies any numbness or tingling in her hands or feet.  Patient did have some mild stomach queasiness when she initially started metformin but she states that this has now resolved.  Past Medical History:  Diagnosis Date  . Diabetes mellitus without complication [facility])    Past Surgical History:  Procedure Laterality Date  . NO PAST SURGERIES     Family History  Problem Relation Age of Onset  . Diabetes Mother   . Healthy Father    Social History   Tobacco Use  . Smoking status: Current Every Day Smoker  . Smokeless tobacco: Never Used  Substance Use Topics   . Alcohol use: No  . Drug use: Never  No Known Allergies    Review of Systems  Constitutional: Negative for chills, fatigue and fever.  HENT: Negative for hearing loss and trouble swallowing.   Eyes: Negative for photophobia and visual disturbance.       Wears glasses for correction of vision  Respiratory: Negative for cough and shortness of breath.   Cardiovascular: Negative for chest pain, palpitations and leg swelling.  Gastrointestinal: Negative for abdominal pain, constipation, diarrhea and nausea.  Endocrine: Negative for polydipsia, polyphagia and polyuria.  Genitourinary: Negative for dysuria and frequency.  Musculoskeletal: Positive for back pain and myalgias. Negative for gait problem, joint swelling, neck pain and neck stiffness.  Neurological: Negative for dizziness, light-headedness and numbness.  Hematological: Negative for adenopathy. Does not bruise/bleed easily.       Objective:   Physical Exam Vitals signs and nursing note reviewed.  Constitutional:      General: She is not in acute distress.    Appearance: Normal appearance. She is obese. She is not ill-appearing.  HENT:     Head: Normocephalic and atraumatic.     Right Ear: Tympanic membrane, ear canal and external ear normal.     Left Ear: Tympanic membrane, ear canal and external ear normal.     Nose: Nose normal.     Mouth/Throat:  Mouth: Mucous membranes are moist.     Pharynx: Posterior oropharyngeal erythema (mild posterior pharynx) present. No oropharyngeal exudate.  Eyes:     Extraocular Movements: Extraocular movements intact.     Conjunctiva/sclera: Conjunctivae normal.  Neck:     Musculoskeletal: Normal range of motion and neck supple. No muscular tenderness.     Vascular: No carotid bruit.     Comments: Fullness in anterior neck but no discrete thyromegaly Cardiovascular:     Rate and Rhythm: Normal rate and regular rhythm.     Pulses: Normal pulses.          Dorsalis pedis pulses are  2+ on the right side and 2+ on the left side.       Posterior tibial pulses are 2+ on the right side and 2+ on the left side.  Pulmonary:     Effort: Pulmonary effort is normal.     Breath sounds: Normal breath sounds. No wheezing or rhonchi.  Abdominal:     General: Bowel sounds are normal.     Palpations: Abdomen is soft.     Tenderness: There is no abdominal tenderness. There is no right CVA tenderness or left CVA tenderness.  Musculoskeletal: Normal range of motion.        General: No tenderness.     Right lower leg: No edema.     Left lower leg: No edema.     Right foot: Normal range of motion. No deformity, bunion, Charcot foot, foot drop or prominent metatarsal heads.     Left foot: Normal range of motion. No deformity, bunion, Charcot foot, foot drop or prominent metatarsal heads.  Feet:     Right foot:     Protective Sensation: 10 sites tested. 10 sites sensed.     Skin integrity: Dry skin present. No ulcer, blister, skin breakdown, erythema, warmth, callus or fissure.     Toenail Condition: Right toenails are normal.     Left foot:     Protective Sensation: 10 sites tested. 10 sites sensed.     Skin integrity: Dry skin present. No ulcer, blister, skin breakdown, erythema, warmth, callus or fissure.     Toenail Condition: Left toenails are normal.  Lymphadenopathy:     Cervical: No cervical adenopathy.  Neurological:     General: No focal deficit present.     Mental Status: She is alert.  Psychiatric:        Mood and Affect: Mood normal.        Thought Content: Thought content normal.    BP 138/90 (BP Location: Left Arm, Patient Position: Sitting, Cuff Size: Large)   Pulse 68   Temp 98.5 F (36.9 C) (Oral)   Resp 18   Ht 5\' 8"  (1.727 m)   Wt 242 lb (109.8 kg)   LMP 10/27/2018   SpO2 100%   BMI 36.80 kg/m         Assessment & Plan:  1. Type 2 diabetes mellitus without complication, without long-term current use of insulin (HCC) Patient's blood sugars have  been well controlled with the use of metformin which she will continue along with a low carbohydrate diet and regular exercise.  Diabetic foot care discussed at today's visit.  Patient will also be scheduled for yearly diabetic eye exam.  Patient's hemoglobin A1c done 10/20/2018 showed good control of patient's blood sugar as her A1c was 5.8.  Patient's blood sugar was inadvertently rechecked by the CMA at today's visit and hemoglobin A1c was 5.4.  Patient  will have TSH, microalbumin creatinine ratio and lipid panel as well as CMP at today's visit in follow-up of her diabetes. - Comprehensive metabolic panel - TSH - Microalbumin/Creatinine Ratio, Urine - Lipid panel - Ambulatory referral to Ophthalmology - HgB A1c  2. Lumbar spondylosis Discussed with the patient that her lumbar spine film that she had done on 10/21/2018 after her last visit showed " diffuse multilevel degenerative change".  Discussed with patient that she has arthritis/degenerative changes in the lumbar spine.  Patient was offered physical therapy referral which she declined at this time.  Patient given prescription refills for for naproxen to take as needed for pain and Robaxin to take as needed for muscle spasm.  3. Screening for breast cancer Patient will be scheduled for screening mammogram.  Monthly self breast exams encouraged and instructions on performing breast exam discussed - MM Digital Screening; Future  An After Visit Summary was printed and given to the patient.  Return in about 6 months (around 05/23/2019) for DM follow-up; schedule well exam.

## 2018-11-23 ENCOUNTER — Other Ambulatory Visit: Payer: Self-pay | Admitting: Family Medicine

## 2018-11-23 DIAGNOSIS — Z79899 Other long term (current) drug therapy: Secondary | ICD-10-CM

## 2018-11-23 DIAGNOSIS — E782 Mixed hyperlipidemia: Secondary | ICD-10-CM

## 2018-11-23 LAB — MICROALBUMIN / CREATININE URINE RATIO
Creatinine, Urine: 376.7 mg/dL
Microalb/Creat Ratio: 6.7 mg/g{creat} (ref 0.0–30.0)
Microalbumin, Urine: 25.2 ug/mL

## 2018-11-23 LAB — COMPREHENSIVE METABOLIC PANEL WITH GFR
ALT: 6 IU/L (ref 0–32)
AST: 6 IU/L (ref 0–40)
Albumin/Globulin Ratio: 1.5 (ref 1.2–2.2)
Albumin: 3.9 g/dL (ref 3.5–5.5)
Alkaline Phosphatase: 82 IU/L (ref 39–117)
BUN/Creatinine Ratio: 16 (ref 9–23)
BUN: 16 mg/dL (ref 6–24)
Bilirubin Total: <0.2 mg/dL (ref 0.0–1.2)
CO2: 19 mmol/L — ABNORMAL LOW (ref 20–29)
Calcium: 9.1 mg/dL (ref 8.7–10.2)
Chloride: 104 mmol/L (ref 96–106)
Creatinine, Ser: 0.97 mg/dL (ref 0.57–1.00)
GFR calc Af Amer: 82 mL/min/1.73
GFR calc non Af Amer: 71 mL/min/1.73
Globulin, Total: 2.6 g/dL (ref 1.5–4.5)
Glucose: 96 mg/dL (ref 65–99)
Potassium: 4.3 mmol/L (ref 3.5–5.2)
Sodium: 140 mmol/L (ref 134–144)
Total Protein: 6.5 g/dL (ref 6.0–8.5)

## 2018-11-23 LAB — LIPID PANEL
Chol/HDL Ratio: 5.8 ratio — ABNORMAL HIGH (ref 0.0–4.4)
Cholesterol, Total: 243 mg/dL — ABNORMAL HIGH (ref 100–199)
HDL: 42 mg/dL
LDL Calculated: 158 mg/dL — ABNORMAL HIGH (ref 0–99)
Triglycerides: 213 mg/dL — ABNORMAL HIGH (ref 0–149)
VLDL Cholesterol Cal: 43 mg/dL — ABNORMAL HIGH (ref 5–40)

## 2018-11-23 LAB — TSH: TSH: 1.1 u[IU]/mL (ref 0.450–4.500)

## 2018-11-23 MED ORDER — ROSUVASTATIN CALCIUM 10 MG PO TABS: 10.0000 mg | ORAL_TABLET | Freq: Every day | ORAL | 6 refills | Status: DC

## 2018-11-23 NOTE — Progress Notes (Signed)
Patient ID: Danielle Moreno, female   DOB: 1976/08/03, 42 y.o.   MRN: [id number]   Patient with recent blood work showing hyperlipidemia with LDL of 158 as well as elevated triglycerides.  Patient will be notified of the results and that a prescription has been sent to her pharmacy for generic Crestor 10 mg daily.  Also patient will need to return to clinic in 4 to 6 weeks after starting new cholesterol medication in order to have LFTs checked.  Order will be placed. Medication sent to [facility] on [address].

## 2018-11-29 ENCOUNTER — Telehealth: Payer: Self-pay | Admitting: *Deleted

## 2018-11-29 DIAGNOSIS — E782 Mixed hyperlipidemia: Secondary | ICD-10-CM

## 2018-11-29 MED ORDER — ROSUVASTATIN CALCIUM 10 MG PO TABS: 10.0000 mg | ORAL_TABLET | Freq: Every day | ORAL | 6 refills | Status: DC

## 2018-11-29 NOTE — Telephone Encounter (Signed)
-----   Message from Cain Saupe, MD sent at 11/23/2018  5:32 PM EST ----- Please notify patient of normal complete metabolic panel (normal electrolytes and liver enzymes). TSH and urine creatinine/microalbumin ratio were normal as well. Lipid panel showed that total cholesterol was elevated at 243, TG elevated at 213, and bad cholesterol LDL is elevated at 158. She needs to be on medication to help lower her cholesterol as being diabetic increases her risk of heart disease. RX will be sent into her pharmacy for generic Crestor to start 10 mg once per day and she should also follow a low fat diet. She should return for a lab visit about 4-6 weeks after starting the cholesterol medication-she does not need to be fasting for the lab visit. Lipids will be rechecked in 4-6 months

## 2018-11-29 NOTE — Telephone Encounter (Signed)
Patient verified DOB Patient is aware of labs being normal except for cholesterol being elevated and needing to take crestor daily. MA resent medication to [facility] pharmacy per patients request. Patient was scheduled for 01/11/2019 at 3 pm for lab draw.

## 2018-12-08 ENCOUNTER — Telehealth: Payer: Self-pay | Admitting: General Practice

## 2018-12-08 DIAGNOSIS — E782 Mixed hyperlipidemia: Secondary | ICD-10-CM

## 2018-12-08 NOTE — Telephone Encounter (Signed)
1) Medication(s) Requested (by name): crestor 2) Pharmacy of Choice:  [facility] [place] [facility]  *pharmacy says they did not receive the prescription

## 2018-12-09 MED ORDER — ROSUVASTATIN CALCIUM 10 MG PO TABS: 10.0000 mg | ORAL_TABLET | Freq: Every day | ORAL | 6 refills | Status: AC

## 2018-12-09 NOTE — Telephone Encounter (Signed)
rx resent  

## 2018-12-13 ENCOUNTER — Telehealth: Payer: Self-pay | Admitting: General Practice

## 2018-12-13 NOTE — Telephone Encounter (Signed)
Pt called to request a Medication change, she needs  -rosuvastatin (CRESTOR) 10 MG tablet  To a cheaper alternative, please follow up [facility] [address] [place], [place] - [address]

## 2018-12-13 NOTE — Telephone Encounter (Signed)
Alternative request

## 2018-12-31 ENCOUNTER — Other Ambulatory Visit: Payer: Self-pay | Admitting: Physician Assistant

## 2018-12-31 DIAGNOSIS — M5441 Lumbago with sciatica, right side: Secondary | ICD-10-CM

## 2018-12-31 MED FILL — NAPROXEN 500 MG TABLET: 500 | 30 days supply | Qty: 60 | Fill #0

## 2018-12-31 MED FILL — METHOCARBAMOL 500 MG TABS: 500 | 15 days supply | Qty: 90 | Fill #0

## 2018-12-31 MED FILL — metFORMIN HCL 500 MG TABS: 500 | 30 days supply | Qty: 60 | Fill #2

## 2019-01-11 ENCOUNTER — Other Ambulatory Visit: Payer: Self-pay

## 2019-01-11 ENCOUNTER — Ambulatory Visit (HOSPITAL_COMMUNITY)
Admission: EM | Admit: 2019-01-11 | Discharge: 2019-01-11 | Disposition: A | Discharge status: Home / Self Care | Payer: BLUE CROSS/BLUE SHIELD | Attending: Family Medicine | Admitting: Family Medicine

## 2019-01-11 ENCOUNTER — Encounter (HOSPITAL_COMMUNITY): Payer: Self-pay

## 2019-01-11 DIAGNOSIS — M545 Low back pain, unspecified: Secondary | ICD-10-CM

## 2019-01-11 DIAGNOSIS — M5441 Lumbago with sciatica, right side: Secondary | ICD-10-CM | POA: Diagnosis not present

## 2019-01-11 DIAGNOSIS — G8929 Other chronic pain: Secondary | ICD-10-CM

## 2019-01-11 MED ORDER — METHOCARBAMOL 500 MG PO TABS: 500.0000 mg | ORAL_TABLET | Freq: Two times a day (BID) | ORAL | 0 refills | Status: DC

## 2019-01-11 NOTE — Discharge Instructions (Addendum)

## 2019-01-11 NOTE — ED Triage Notes (Signed)
Pt presents with severe back pain.

## 2019-01-12 NOTE — ED Provider Notes (Signed)
[facility]   [id number] 01/11/19 Arrival Time: 1825  ASSESSMENT & PLAN:  1. Chronic right-sided low back pain without sciatica   2. Acute right-sided low back pain with right-sided sciatica    Able to ambulate here and hemodynamically stable. No indication for imaging of back at this time given no trauma and normal neurological exam. Discussed.  Meds ordered this encounter  Medications  . methocarbamol (ROBAXIN) 500 MG tablet    Sig: Take 1 tablet (500 mg total) by mouth 2 (two) times daily.    Dispense:  20 tablet    Refill:  0  Reports muscle relaxer has worked well in the past.  Medication sedation precautions given. Encourage ROM/movement as tolerated.  May f/u here as needed.  Reviewed expectations re: course of current medical issues. Questions answered. Outlined signs and symptoms indicating need for more acute intervention. Patient verbalized understanding. After Visit Summary given.   SUBJECTIVE: History from: patient.  Danielle Moreno is a 43 y.o. female who presents with complaint of intermittent bilateral lower back discomfort. Onset gradual, a few days ago. Injury/trama: no. History of back problems: frequent; same as current symptoms. Discomfort described as aching with occasional sharp exacerbations; without radiation. Pain worsens with certain movements and is slightly improved with rest. Progressive LE weakness or saddle anesthesia: none. Extremity sensation changes or weakness: none. Ambulatory without difficulty. Normal bowel/bladder habits: yes, without urinary retention. No associated abdominal pain/n/v. Self treatment: has tried OTC ibuprofen with mild help.  Reports no chronic steroid use, fevers, IV drug use, or recent back surgeries or procedures.  ROS: As per HPI.   OBJECTIVE:  Vitals:   01/11/19 1859  BP: (!) 180/105  Pulse: 89  Resp: 18  Temp: 98.9 F (37.2 C)  TempSrc: Oral  SpO2: 100%    General appearance: alert; no  distress Neck: supple with FROM; without midline tenderness CV: RRR without murmer Lungs: unlabored respirations; symmetrical air entry Abdomen: soft, non-tender; non-distended Back: mild to moderate bilateral tenderness of her lower paraspinal musculature that is poorly localized; FROM at waist; bruising: none; without midline tenderness Extremities: no edema; symmetrical with no gross deformities; normal ROM of bilateral lower extremities Skin: warm and dry Neurologic: normal gait; normal reflexes of RLE and LLE; normal sensation of RLE and LLE; normal strength of RLE and LLE Psychological: alert and cooperative; normal mood and affect   No Known Allergies  Past Medical History:  Diagnosis Date  . Diabetes mellitus without complication [facility])    Social History   Socioeconomic History  . Marital status: Married    Spouse name: Not on file  . Number of children: Not on file  . Years of education: Not on file  . Highest education level: Not on file  Occupational History  . Not on file  Social Needs  . Financial resource strain: Not on file  . Food insecurity:    Worry: Not on file    Inability: Not on file  . Transportation needs:    Medical: Not on file    Non-medical: Not on file  Tobacco Use  . Smoking status: Current Every Day Smoker  . Smokeless tobacco: Never Used  Substance and Sexual Activity  . Alcohol use: No  . Drug use: Never  . Sexual activity: Not on file  Lifestyle  . Physical activity:    Days per week: Not on file    Minutes per session: Not on file  . Stress: Not on file  Relationships  .  Social connections:    Talks on phone: Not on file    Gets together: Not on file    Attends religious service: Not on file    Active member of club or organization: Not on file    Attends meetings of clubs or organizations: Not on file    Relationship status: Not on file  . Intimate partner violence:    Fear of current or ex partner: Not on file     Emotionally abused: Not on file    Physically abused: Not on file    Forced sexual activity: Not on file  Other Topics Concern  . Not on file  Social History Narrative  . Not on file   Family History  Problem Relation Age of Onset  . Diabetes Mother   . Healthy Father    Past Surgical History:  Procedure Laterality Date  . NO PAST SURGERIES       Mardella Layman, MD 01/12/19 [id number]

## 2019-01-13 ENCOUNTER — Ambulatory Visit (HOSPITAL_COMMUNITY)
Admission: EM | Admit: 2019-01-13 | Discharge: 2019-01-13 | Disposition: A | Discharge status: Home / Self Care | Payer: BLUE CROSS/BLUE SHIELD | Attending: Family Medicine | Admitting: Family Medicine

## 2019-01-13 ENCOUNTER — Encounter (HOSPITAL_COMMUNITY): Payer: Self-pay | Admitting: Emergency Medicine

## 2019-01-13 DIAGNOSIS — M545 Low back pain: Secondary | ICD-10-CM

## 2019-01-13 DIAGNOSIS — M5442 Lumbago with sciatica, left side: Secondary | ICD-10-CM

## 2019-01-13 MED ORDER — KETOROLAC TROMETHAMINE 60 MG/2ML IM SOLN
INTRAMUSCULAR | Status: AC
  Filled 2019-01-13: qty 2

## 2019-01-13 MED ORDER — CYCLOBENZAPRINE HCL 5 MG PO TABS: 5.0000 mg | ORAL_TABLET | Freq: Two times a day (BID) | ORAL | 0 refills | Status: DC | PRN

## 2019-01-13 MED ORDER — PREDNISONE 50 MG PO TABS: 50.0000 mg | ORAL_TABLET | Freq: Every day | ORAL | 0 refills | Status: DC

## 2019-01-13 MED ORDER — KETOROLAC TROMETHAMINE 60 MG/2ML IM SOLN
60.0000 mg | Freq: Once | INTRAMUSCULAR | Status: AC
  Administered 2019-01-13: 60 mg via INTRAMUSCULAR

## 2019-01-13 MED ORDER — IBUPROFEN 600 MG PO TABS: 600.0000 mg | ORAL_TABLET | Freq: Four times a day (QID) | ORAL | 0 refills | Status: DC | PRN

## 2019-01-13 MED ORDER — DEXAMETHASONE SODIUM PHOSPHATE 10 MG/ML IJ SOLN
INTRAMUSCULAR | Status: AC
  Filled 2019-01-13: qty 1

## 2019-01-13 MED ORDER — DEXAMETHASONE SODIUM PHOSPHATE 10 MG/ML IJ SOLN
10.0000 mg | Freq: Once | INTRAMUSCULAR | Status: AC
  Administered 2019-01-13: 10 mg via INTRAMUSCULAR

## 2019-01-13 NOTE — ED Provider Notes (Signed)
[facility]    CSN: [id number] Arrival date & time: 01/13/19  1314     History   Chief Complaint Chief Complaint  Patient presents with  . Back Pain    HPI DHYANA OESTERLE is a 43 y.o. female history of DM type II, presenting today for evaluation of lower back pain.  Patient states that she has had lower back pain for approximately 1 week.  She denies any injury or increase in activity or recent heavy lifting.  She was seen here a few days ago and treated with Robaxin.  She has not had any improvement in symptoms with Robaxin.  She continues to have pain with activity and movements.  She is previously had negative x-rays.  She does feel the pain radiating into her left leg.  Denies numbness or tingling.  She does have a spasming sensation.  Denies issues with urination or bowel movements.  Denies saddle anesthesia.  She has not been taking anything else besides the Robaxin.  HPI  Past Medical History:  Diagnosis Date  . Diabetes mellitus without complication [facility])     Patient Active Problem List   Diagnosis Date Noted  . Type 2 diabetes mellitus without complication, without long-term current use of insulin (HCC) 11/22/2018  . Lumbar spondylosis 11/22/2018    Past Surgical History:  Procedure Laterality Date  . NO PAST SURGERIES      OB History   No obstetric history on file.      Home Medications    Prior to Admission medications   Medication Sig Start Date End Date Taking? Authorizing Provider  metFORMIN (GLUCOPHAGE) 500 MG tablet Take 1 tablet (500 mg total) by mouth 2 (two) times daily with a meal. 10/20/18  Yes McClung, Angela M, PA-C  cyclobenzaprine (FLEXERIL) 5 MG tablet Take 1-2 tablets (5-10 mg total) by mouth 2 (two) times daily as needed for muscle spasms. 01/13/19   ,  C, PA-C  diclofenac sodium (VOLTAREN) 1 % GEL Apply 2 g topically 4 (four) times daily. 10/20/18   Anders Simmonds, PA-C  ibuprofen (ADVIL,MOTRIN) 600 MG tablet  Take 1 tablet (600 mg total) by mouth every 6 (six) hours as needed. 01/13/19   ,  C, PA-C  methocarbamol (ROBAXIN) 500 MG tablet Take 1 tablet (500 mg total) by mouth 2 (two) times daily. 01/11/19   Mardella Layman, MD  naproxen (NAPROSYN) 500 MG tablet TAKE 1 TABLET (500 MG TOTAL) BY MOUTH 2 (TWO) TIMES DAILY WITH A MEAL FOR 10 DAYS THEN AS NEEDED FOR PAIN 12/31/18   Fulp, Hewitt Shorts, MD  NON FORMULARY     [provider]  predniSONE (DELTASONE) 50 MG tablet Take 1 tablet (50 mg total) by mouth daily for 5 days. Start on 2/28 01/13/19 01/18/19  ,  C, PA-C  rosuvastatin (CRESTOR) 10 MG tablet Take 1 tablet (10 mg total) by mouth daily. To lower cholesterol 12/09/18   Cain Saupe, MD    Family History Family History  Problem Relation Age of Onset  . Diabetes Mother   . Healthy Father     Social History Social History   Tobacco Use  . Smoking status: Current Every Day Smoker  . Smokeless tobacco: Never Used  Substance Use Topics  . Alcohol use: No  . Drug use: Never     Allergies   Patient has no known allergies.   Review of Systems Review of Systems  Constitutional: Negative for fatigue and fever.  HENT: Negative for congestion,  sinus pressure and sore throat.   Eyes: Negative for photophobia, pain and visual disturbance.  Respiratory: Negative for cough and shortness of breath.   Cardiovascular: Negative for chest pain.  Gastrointestinal: Negative for abdominal pain, nausea and vomiting.  Genitourinary: Negative for decreased urine volume, difficulty urinating, dysuria and hematuria.  Musculoskeletal: Positive for back pain and myalgias. Negative for neck pain and neck stiffness.  Neurological: Negative for dizziness, syncope, facial asymmetry, speech difficulty, weakness, light-headedness, numbness and headaches.     Physical Exam Triage Vital Signs ED Triage Vitals  Enc Vitals Group     BP 01/13/19 1421 (!) 151/95     Pulse Rate 01/13/19  1421 88     Resp 01/13/19 1421 18     Temp 01/13/19 1421 97.9 F (36.6 C)     Temp Source 01/13/19 1421 Oral     SpO2 01/13/19 1421 95 %     Weight --      Height --      Head Circumference --      Peak Flow --      Pain Score 01/13/19 1427 10     Pain Loc --      Pain Edu? --      Excl. in GC? --    No data found.  Updated Vital Signs BP (!) 151/95 (BP Location: Right Arm)   Pulse 88   Temp 97.9 F (36.6 C) (Oral)   Resp 18   LMP 01/04/2019   SpO2 95%   Visual Acuity Right Eye Distance:   Left Eye Distance:   Bilateral Distance:    Right Eye Near:   Left Eye Near:    Bilateral Near:     Physical Exam Vitals signs and nursing note reviewed.  Constitutional:      Appearance: She is well-developed.     Comments: Slightly tearful, appears uncomfortable  HENT:     Head: Normocephalic and atraumatic.  Eyes:     Conjunctiva/sclera: Conjunctivae normal.  Neck:     Musculoskeletal: Neck supple.  Cardiovascular:     Rate and Rhythm: Normal rate and regular rhythm.     Heart sounds: No murmur.  Pulmonary:     Effort: Pulmonary effort is normal. No respiratory distress.     Breath sounds: Normal breath sounds.  Abdominal:     Palpations: Abdomen is soft.     Tenderness: There is no abdominal tenderness.  Musculoskeletal:     Comments: Nontender to cervical and thoracic spine midline, tenderness to lower lumbar/upper sacral area midline, increased tenderness throughout bilateral lumbar regions, worse on left than right, back pain worsens with straight leg raise, but does not radiate.  Hip strength 5/5 and equal bilaterally, knee strength 5/5 bilaterally, patellar reflexes 2+ bilaterally  Skin:    General: Skin is warm and dry.  Neurological:     Mental Status: She is alert.      UC Treatments / Results  Labs (all labs ordered are listed, but only abnormal results are displayed) Labs Reviewed - No data to display  EKG None  Radiology No results  found.  Procedures Procedures (including critical care time)  Medications Ordered in UC Medications  ketorolac (TORADOL) injection 60 mg (60 mg Intramuscular Given 01/13/19 1453)  dexamethasone (DECADRON) injection 10 mg (10 mg Intramuscular Given 01/13/19 1453)    Initial Impression / Assessment and Plan / UC Course  I have reviewed the triage vital signs and the nursing notes.  Pertinent labs & imaging results  that were available during my care of the patient were reviewed by me and considered in my medical decision making (see chart for details).    Patient with lower back pain, possible sciatica component.  Will treat with Toradol and Decadron in clinic today, will continue on prednisone and NSAIDs at home.  Discussed increased risk of GI bleed and ulcers with taking them both together.  Ice to take with food.  We will also try Flexeril as alternative.  Discussed drowsiness regarding Flexeril and advised only use at home or bedtime.  Do not drive or work after taking.  No neuro deficits, no red flags for cauda equina.  Continue to monitor,Discussed strict return precautions. Patient verbalized understanding and is agreeable with plan.  Final Clinical Impressions(s) / UC Diagnoses   Final diagnoses:  Acute bilateral low back pain with left-sided sciatica     Discharge Instructions     We gave you an injection of Toradol and Decadron today to help with your back pain. Please begin prednisone beginning tomorrow daily for the next 5 days, take with food intake in the morning Use anti-inflammatories for pain/swelling. You may take up to 800 mg Ibuprofen every 8 hours with food. You may supplement Ibuprofen with Tylenol [phone] mg every 8 hours.  Please avoid taking ibuprofen at the same time as prednisone to decrease risk GI bleed/ulcer. You may use flexeril as needed to help with pain. This is a muscle relaxer and causes sedation- please use only at bedtime or when you will be home  and not have to drive/work-begin with 1 tablet, may increase to 2 if needed Alternate ice and heat Avoid heavy lifting, but please also avoid complete bedrest  Please follow-up if symptoms persisting, worsening, developing weakness in legs, numbness or tingling, difficulty controlling urination or bowel movements    ED Prescriptions    Medication Sig Dispense Auth. Provider   predniSONE (DELTASONE) 50 MG tablet Take 1 tablet (50 mg total) by mouth daily for 5 days. Start on 2/28 5 tablet ,  C, PA-C   ibuprofen (ADVIL,MOTRIN) 600 MG tablet Take 1 tablet (600 mg total) by mouth every 6 (six) hours as needed. 30 tablet ,  C, PA-C   cyclobenzaprine (FLEXERIL) 5 MG tablet Take 1-2 tablets (5-10 mg total) by mouth 2 (two) times daily as needed for muscle spasms. 24 tablet , [place] C, PA-C     Controlled Substance Prescriptions [place] Controlled Substance Registry consulted? Not Applicable   Lew Dawes, [place] 01/13/19 1525

## 2019-01-13 NOTE — ED Triage Notes (Signed)
PT C/O: lower back pain onset 5 days ... seen here on 01/11/2019 for similar sx  Sts pain is increasing and increases w/activity  Has also been to ED for similr sx but sts "they don't do nothing for"  Pt is wanting something stronger.   DENIES: inj/trauma  TAKING MEDS: Robaxin w/no relief.... pt would like something stronger  A&O x4... NAD... Ambulatory

## 2019-01-13 NOTE — Discharge Instructions (Signed)
We gave you an injection of Toradol and Decadron today to help with your back pain. Please begin prednisone beginning tomorrow daily for the next 5 days, take with food intake in the morning Use anti-inflammatories for pain/swelling. You may take up to 800 mg Ibuprofen every 8 hours with food. You may supplement Ibuprofen with Tylenol [phone] mg every 8 hours.  Please avoid taking ibuprofen at the same time as prednisone to decrease risk GI bleed/ulcer. You may use flexeril as needed to help with pain. This is a muscle relaxer and causes sedation- please use only at bedtime or when you will be home and not have to drive/work-begin with 1 tablet, may increase to 2 if needed Alternate ice and heat Avoid heavy lifting, but please also avoid complete bedrest  Please follow-up if symptoms persisting, worsening, developing weakness in legs, numbness or tingling, difficulty controlling urination or bowel movements

## 2019-01-14 ENCOUNTER — Encounter (HOSPITAL_COMMUNITY): Payer: Self-pay | Admitting: *Deleted

## 2019-01-14 ENCOUNTER — Emergency Department (HOSPITAL_COMMUNITY): Payer: BLUE CROSS/BLUE SHIELD

## 2019-01-14 ENCOUNTER — Other Ambulatory Visit: Payer: Self-pay

## 2019-01-14 ENCOUNTER — Emergency Department (HOSPITAL_COMMUNITY)
Admission: EM | Admit: 2019-01-14 | Discharge: 2019-01-14 | Disposition: A | Discharge status: Home / Self Care | Payer: BLUE CROSS/BLUE SHIELD | Attending: Emergency Medicine | Admitting: Emergency Medicine

## 2019-01-14 DIAGNOSIS — Z79899 Other long term (current) drug therapy: Secondary | ICD-10-CM | POA: Diagnosis not present

## 2019-01-14 DIAGNOSIS — G8929 Other chronic pain: Secondary | ICD-10-CM

## 2019-01-14 DIAGNOSIS — M5442 Lumbago with sciatica, left side: Secondary | ICD-10-CM | POA: Diagnosis not present

## 2019-01-14 DIAGNOSIS — E119 Type 2 diabetes mellitus without complications: Secondary | ICD-10-CM | POA: Diagnosis not present

## 2019-01-14 DIAGNOSIS — Z7984 Long term (current) use of oral hypoglycemic drugs: Secondary | ICD-10-CM | POA: Diagnosis not present

## 2019-01-14 DIAGNOSIS — F17290 Nicotine dependence, other tobacco product, uncomplicated: Secondary | ICD-10-CM | POA: Insufficient documentation

## 2019-01-14 DIAGNOSIS — M545 Low back pain: Secondary | ICD-10-CM | POA: Diagnosis present

## 2019-01-14 MED ORDER — NAPROXEN 500 MG PO TABS: 500.0000 mg | ORAL_TABLET | Freq: Two times a day (BID) | ORAL | 0 refills | Status: DC

## 2019-01-14 MED ORDER — KETOROLAC TROMETHAMINE 30 MG/ML IJ SOLN
30.0000 mg | Freq: Once | INTRAMUSCULAR | Status: AC
  Administered 2019-01-14: 30 mg via INTRAMUSCULAR
  Filled 2019-01-14: qty 1

## 2019-01-14 MED ORDER — LIDOCAINE 5 % EX PTCH
1.0000 | MEDICATED_PATCH | CUTANEOUS | Status: DC
  Administered 2019-01-14: 1 via TRANSDERMAL
  Filled 2019-01-14: qty 1

## 2019-01-14 MED ORDER — LIDOCAINE 5 % EX PTCH: 1.0000 | MEDICATED_PATCH | CUTANEOUS | 0 refills | Status: DC

## 2019-01-14 NOTE — ED Notes (Signed)
Patient states the last time she took any medicine for pain was around 1900 last night

## 2019-01-14 NOTE — ED Provider Notes (Addendum)
[facility]-EMERGENCY DEPT Provider Note   CSN: [id number] Arrival date & time: 01/14/19  0604  History   Chief Complaint Chief Complaint  Patient presents with  . Back Pain    HPI Danielle Moreno is a 43 y.o. female with past medical history significant for type 2 diabetes, chronic lumbar spondylosis with sciatica presents for evaluation of low back pain.  Patient states she has had left-sided low back pain x1 week.  Patient states pain radiates down the posterior of her left leg.  She states she was seen by urgent care yesterday and was given Toradol and prednisone.  Patient states she has a history of chronic lumbar back pain beginning 5 months PTA, however normally has sciatica on the right side.  Patient states her pain feels similar however is on the left side now instead of the right.  Patient states she took 1 dose of Flexeril yesterday evening.  Patient states she woke up this morning and had 10/10 pain.  States pain is worse with bending, twisting or walking.  Patient describes pain as a sharp shooting pain going down her left leg as well as a spasming sensation to her left lumbar spine.  Denies fever, chills, nausea, vomiting, numbness, tingling in her extremities, weakness in her extremities, swelling, warmth.  Denies recent injuries or recent heavy lifting.  Patient states she has not been told previously to follow-up with orthopedics for her chronic back pain.  Denies history of IV drug use, bowel or bladder incontinence, saddle paresthesia, inability to fully empty her bladder, increased nighttime pain, unintended weight loss, history of malignancy.  She has not taken anything over the last 24 hours except for the dose of Flexeril yesterday evening at 1900. Patient was prescribed Prednisone yesterday at [facility]. Will be starting this today. Denies long term use of steroids. Patient states her back pain usually will self resolve and then return 2 weeks after each  episode. Ambulatory without difficulty PTA.  Patient is driving home.  History provided by patient. No interpretor was used.     HPI  Past Medical History:  Diagnosis Date  . Diabetes mellitus without complication [facility])     Patient Active Problem List   Diagnosis Date Noted  . Type 2 diabetes mellitus without complication, without long-term current use of insulin (HCC) 11/22/2018  . Lumbar spondylosis 11/22/2018    Past Surgical History:  Procedure Laterality Date  . NO PAST SURGERIES       OB History   No obstetric history on file.      Home Medications    Prior to Admission medications   Medication Sig Start Date End Date Taking? Authorizing Provider  cyclobenzaprine (FLEXERIL) 5 MG tablet Take 1-2 tablets (5-10 mg total) by mouth 2 (two) times daily as needed for muscle spasms. 01/13/19   Wieters, Hallie C, PA-C  diclofenac sodium (VOLTAREN) 1 % GEL Apply 2 g topically 4 (four) times daily. 10/20/18   Anders Simmonds, PA-C  ibuprofen (ADVIL,MOTRIN) 600 MG tablet Take 1 tablet (600 mg total) by mouth every 6 (six) hours as needed. 01/13/19   Wieters, Hallie C, PA-C  lidocaine (LIDODERM) 5 % Place 1 patch onto the skin daily. Remove & Discard patch within 12 hours or as directed by MD 01/14/19   Saed Hudlow A, PA-C  metFORMIN (GLUCOPHAGE) 500 MG tablet Take 1 tablet (500 mg total) by mouth 2 (two) times daily with a meal. 10/20/18   Anders Simmonds, PA-C  methocarbamol (ROBAXIN) 500 MG tablet Take 1 tablet (500 mg total) by mouth 2 (two) times daily. 01/11/19   Mardella Layman, MD  naproxen (NAPROSYN) 500 MG tablet Take 1 tablet (500 mg total) by mouth 2 (two) times daily. 01/14/19   Aolani Piggott A, PA-C  NON FORMULARY     [provider]  predniSONE (DELTASONE) 50 MG tablet Take 1 tablet (50 mg total) by mouth daily for 5 days. Start on 2/28 01/13/19 01/18/19  Wieters, Hallie C, PA-C  rosuvastatin (CRESTOR) 10 MG tablet Take 1 tablet (10 mg total) by mouth  daily. To lower cholesterol 12/09/18   Cain Saupe, MD    Family History Family History  Problem Relation Age of Onset  . Diabetes Mother   . Healthy Father     Social History Social History   Tobacco Use  . Smoking status: Current Every Day Smoker  . Smokeless tobacco: Never Used  Substance Use Topics  . Alcohol use: No  . Drug use: Never     Allergies   Patient has no known allergies.   Review of Systems Review of Systems  Constitutional: Negative.   HENT: Negative.   Respiratory: Negative.   Cardiovascular: Negative.   Gastrointestinal: Negative.   Genitourinary: Negative.   Musculoskeletal: Positive for back pain. Negative for neck pain and neck stiffness.       Can walk however painful.  Skin: Negative.   Neurological: Negative.   All other systems reviewed and are negative.    Physical Exam Updated Vital Signs BP (!) 168/106   Pulse 74   Temp 97.8 F (36.6 C) (Oral)   Resp 18   Ht 5\' 8"  (1.727 m)   Wt 112.9 kg   LMP 01/04/2019 (Exact Date)   SpO2 100%   BMI 37.86 kg/m   Physical Exam   Physical Exam  Constitutional: Pt appears well-developed and well-nourished. No distress.  HENT:  Head: Normocephalic and atraumatic.  Mouth/Throat: Oropharynx is clear and moist. No oropharyngeal exudate.  Eyes: Conjunctivae are normal.  Neck: Normal range of motion. Neck supple.  Full ROM without pain  Cardiovascular: Normal rate, regular rhythm and intact distal pulses.   Pulmonary/Chest: Effort normal and breath sounds normal. No respiratory distress. Pt has no wheezes.  Abdominal: Soft. Pt exhibits no distension. There is no tenderness, rebound or guarding. No abd bruit or pulsatile mass Musculoskeletal:  Full range of motion of the T-spine and L-spine with flexion, hyperextension, and lateral flexion. No midline tenderness or stepoffs. No tenderness to palpation of the spinous processes of the T-spine or L-spine. Mild tenderness to palpation of the  paraspinous muscles of the L-spine. Positive straight leg raise on left at 35'. Tenderness over left piriformis muscle. Lymphadenopathy:    Pt has no cervical adenopathy.  Neurological: Pt is alert. Pt has normal reflexes.  Reflex Scores:      Patellar reflexes are 2+ on the right side and 2+ on the left side.      Achilles reflexes are 2+ on the right side and 2+ on the left side. Speech is clear and goal oriented, follows commands Normal 5/5 strength in upper and lower extremities bilaterally including dorsiflexion and plantar flexion, strong and equal grip strength Sensation normal to light and sharp touch Moves extremities without ataxia, coordination intact Normal gait Normal balance No Clonus Skin: Skin is warm and dry. No rash noted or lesions noted. Pt is not diaphoretic. No erythema, ecchymosis,edema or warmth.  Psychiatric: Pt has a normal  mood and affect. Behavior is normal.  Nursing note and vitals reviewed. ED Treatments / Results  Labs (all labs ordered are listed, but only abnormal results are displayed) Labs Reviewed - No data to display  EKG None  Radiology Dg Lumbar Spine Complete  Result Date: 01/14/2019 CLINICAL DATA:  Back pain with left sciatica. EXAM: LUMBAR SPINE - COMPLETE 4+ VIEW COMPARISON:  10/21/2018 FINDINGS: Normal alignment of the lumbar spine. The vertebral body heights are maintained. Again noted are degenerative endplate changes at L4-L5 that are similar to the previous examination. Mild disc space narrowing at L3-L4 and L4-L5. Negative for pars defect. IMPRESSION: Mild degenerative changes in the lumbar spine most prominent at L4-L5. Stable findings from previous examination. Electronically Signed   By: Richarda Overlie M.D.   On: 01/14/2019 07:46    Procedures Procedures (including critical care time)  Medications Ordered in ED Medications  ketorolac (TORADOL) 30 MG/ML injection 30 mg (30 mg Intramuscular Given 01/14/19 0805)    Initial Impression  / Assessment and Plan / ED Course  I have reviewed the triage vital signs and the nursing notes.  Pertinent labs & imaging results that were available during my care of the patient were reviewed by me and considered in my medical decision making (see chart for details).  43 year old who presents for evaluation of back pain. Afebrile, nonseptic, non-ill appearing. No IVDU, hx malignancy, bowel or bladder incontinence, saddle paresthesias, inability to empty bladder, increased night time pain. No red flag signs, low suspicion for cauda equina, transverse myelitis, discitis, osteomyelitis, psoas abscess. Normal musculoskeletal exam however tenderness over left piriformis muscle. Positive straight leg raise on left at 35'. Normal neurologic exam without neurologic deficits.  Patient is able to ambulate, however states it is painful.  Full two-point discrimination bilateral lower extremities.  Bilateral lower extremities reflexes 2+. No abdominal pain that radiates into back. No chest pain. Patient requesting repeat xray at this time. Will order.  Patient is driving home. Do not feel safe giving her narcotics for her pain while in the ED. Offered Toradol injection and lidoderm patches. Offered additional medication if she can find a ride home. Declines Toradol injection. Will place lidoderm patches.  On reevaluation, patient states "I will try the shot now."  X-ray without acute findings, similar to previous films.  Patient has been ambulatory in room without difficulty. No fever, night sweats, weight loss, h/o cancer, IVDU.  Discussion with patient she needs follow-up with neurosurgery or orthopedics for her back pain.  We will give resources for both at DC.  Discussed RICE protocol and pain medicine indicated and discussed with patient.  Patiently has been hypertensive in department.  Has history of hypertension, however is not on medication for this.  Asymptomatic without headache, vision changes, chest pain,  shortness of breath, nausea or vomiting or abdominal pain.  Discussed with patient she will need follow-up with PCP for reevaluation and recheck of her blood pressure.  Hemodynamically stable and appropriate for DC home at this time.  I have discussed strict return precautions with patient. Patient voiced understanding and is agreeable for follow-up.  0815: Nursing has notified me that patient requesting more for pain at this time at dc. Discussed that patient needs a ride home for narcotic pain medication to be administered in ED. Offered additional medication if patient can get a ride home x 3. Patient states that she is unable to obtain a ride home and "I have to drive home."  Encouraged follow  up with Orthopedics or Neurosurgery given her intermittent reoccurring back pain.     Final Clinical Impressions(s) / ED Diagnoses   Final diagnoses:  Chronic left-sided low back pain with left-sided sciatica    ED Discharge Orders         Ordered    lidocaine (LIDODERM) 5 %  Every 24 hours     01/14/19 0802    naproxen (NAPROSYN) 500 MG tablet  2 times daily     01/14/19 0802           Dacy Enrico A, PA-C 01/14/19 0809    Emelio Schneller A, PA-C 01/14/19 1446    Zadie Rhine, MD 01/14/19 2307

## 2019-01-14 NOTE — ED Triage Notes (Signed)
Pt c/o LBP radiating in to LLE.  Pt stated "I was seen @ UC yesterday and was told if it got worse to go to the ER."

## 2019-01-14 NOTE — Discharge Instructions (Addendum)
Your evaluated today for back pain.  This is likely sciatica.  Please continue take the prednisone and Flexeril as you were previously prescribed.  Have also given you lidocaine patches.  Please take these as prescribed.  You will need to keep these placed for 12 hours and then removed for 12 hours.  You will need to have a 12-hour period without a patch before he can place a new one.  I have referred you to neurosurgery as well as orthopedics.  You will need to call 1 of them to schedule an appointment for your back pain.  Given you have had this for an extended period of time I feel you need further work-up.  Return the ED for any worsening symptoms.

## 2019-01-16 ENCOUNTER — Emergency Department (HOSPITAL_COMMUNITY)
Admission: EM | Admit: 2019-01-16 | Discharge: 2019-01-16 | Disposition: A | Discharge status: Home / Self Care | Payer: BLUE CROSS/BLUE SHIELD | Source: Home / Self Care | Attending: Emergency Medicine | Admitting: Emergency Medicine

## 2019-01-16 ENCOUNTER — Other Ambulatory Visit: Payer: Self-pay

## 2019-01-16 ENCOUNTER — Encounter (HOSPITAL_COMMUNITY): Payer: Self-pay | Admitting: Emergency Medicine

## 2019-01-16 ENCOUNTER — Emergency Department (HOSPITAL_COMMUNITY): Payer: BLUE CROSS/BLUE SHIELD

## 2019-01-16 DIAGNOSIS — E119 Type 2 diabetes mellitus without complications: Secondary | ICD-10-CM | POA: Insufficient documentation

## 2019-01-16 DIAGNOSIS — Z7984 Long term (current) use of oral hypoglycemic drugs: Secondary | ICD-10-CM | POA: Insufficient documentation

## 2019-01-16 DIAGNOSIS — M48061 Spinal stenosis, lumbar region without neurogenic claudication: Secondary | ICD-10-CM | POA: Insufficient documentation

## 2019-01-16 DIAGNOSIS — F17210 Nicotine dependence, cigarettes, uncomplicated: Secondary | ICD-10-CM | POA: Insufficient documentation

## 2019-01-16 DIAGNOSIS — R29898 Other symptoms and signs involving the musculoskeletal system: Secondary | ICD-10-CM | POA: Diagnosis not present

## 2019-01-16 DIAGNOSIS — M5116 Intervertebral disc disorders with radiculopathy, lumbar region: Secondary | ICD-10-CM | POA: Diagnosis not present

## 2019-01-16 MED ORDER — METHYLPREDNISOLONE 4 MG PO TBPK: ORAL_TABLET | ORAL | 0 refills | Status: DC

## 2019-01-16 MED ORDER — OXYCODONE-ACETAMINOPHEN 5-325 MG PO TABS: 1.0000 | ORAL_TABLET | Freq: Four times a day (QID) | ORAL | 0 refills | Status: DC | PRN

## 2019-01-16 MED ORDER — HYDROMORPHONE HCL 2 MG/ML IJ SOLN
2.0000 mg | Freq: Once | INTRAMUSCULAR | Status: AC
  Administered 2019-01-16: 2 mg via INTRAMUSCULAR
  Filled 2019-01-16: qty 1

## 2019-01-16 MED ORDER — IBUPROFEN 200 MG PO TABS
600.0000 mg | ORAL_TABLET | Freq: Once | ORAL | Status: AC
  Administered 2019-01-16: 600 mg via ORAL
  Filled 2019-01-16: qty 3

## 2019-01-16 MED ORDER — METHOCARBAMOL 500 MG PO TABS
1000.0000 mg | ORAL_TABLET | Freq: Once | ORAL | Status: AC
  Administered 2019-01-16: 1000 mg via ORAL
  Filled 2019-01-16: qty 2

## 2019-01-16 MED ORDER — ONDANSETRON 8 MG PO TBDP
8.0000 mg | ORAL_TABLET | Freq: Once | ORAL | Status: AC
  Administered 2019-01-16: 8 mg via ORAL
  Filled 2019-01-16: qty 1

## 2019-01-16 NOTE — ED Notes (Signed)
Patient returned from MRI.

## 2019-01-16 NOTE — ED Notes (Signed)
Patient transported to MRI 

## 2019-01-16 NOTE — ED Triage Notes (Signed)
Pt reports having back pains and left leg pains and numbness. Been seen here and UC couple times for the symptoms and medications been given havent helped with the pains.

## 2019-01-16 NOTE — ED Provider Notes (Signed)
[facility]-EMERGENCY DEPT Provider Note   CSN: [id number] Arrival date & time: 01/16/19  1713    History   Chief Complaint Chief Complaint  Patient presents with  . leg numbness  . Back Pain    HPI Danielle Moreno is a 43 y.o. female.     HPI This is the patient's fourth visit to an urgent care/ER in the past 5 days.  She is had progressively worsening low back pain with radiation to both legs.  States she is now having complete numbness to the left leg and numbness over the right medial foot.  She is had some difficulty ambulating due to weakness of the left leg.  She denies difficulty urinating or incontinence.  No new trauma.  No fever or chills.  She has not seen an orthopedist or neurosurgeon. Past Medical History:  Diagnosis Date  . Diabetes mellitus without complication [facility])     Patient Active Problem List   Diagnosis Date Noted  . 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 Type 2 diabetes mellitus without complication, without long-term current use of insulin (HCC) 11/22/2018  . Lumbar spondylosis 11/22/2018    Past Surgical History:  Procedure Laterality Date  . NO PAST SURGERIES       OB History   No obstetric history on file.      Home Medications    Prior to Admission medications   Medication Sig Start Date End Date Taking? Authorizing Provider  cyclobenzaprine (FLEXERIL) 5 MG tablet Take 1-2 tablets (5-10 mg total) by mouth 2 (two) times daily as needed for muscle spasms. 01/13/19  Yes Wieters, Hallie C, PA-C  ibuprofen (ADVIL,MOTRIN) 600 MG tablet Take 1 tablet (600 mg total) by mouth every 6 (six) hours as needed. Patient taking differently: Take 600 mg by mouth every 6 (six) hours as needed for headache, mild pain or moderate pain.  01/13/19  Yes Wieters, Hallie C, PA-C  metFORMIN (GLUCOPHAGE) 500 MG tablet Take 1 tablet (500 mg total) by mouth 2 (two) times daily with a meal. 10/20/18  Yes McClung, Angela M, PA-C    methocarbamol (ROBAXIN) 500 MG tablet Take 1 tablet (500 mg total) by mouth 2 (two) times daily. Patient taking differently: Take 500 mg by mouth 2 (two) times daily as needed for muscle spasms.  01/11/19  Yes Mardella Layman, MD  naproxen (NAPROSYN) 500 MG tablet Take 1 tablet (500 mg total) by mouth 2 (two) times daily. 01/14/19  Yes Henderly, Britni A, PA-C  predniSONE (DELTASONE) 50 MG tablet Take 1 tablet (50 mg total) by mouth daily for 5 days. Start on 2/28 01/13/19 01/18/19 Yes Wieters, Hallie C, PA-C  diclofenac sodium (VOLTAREN) 1 % GEL Apply 2 g topically 4 (four) times daily. Patient not taking: Reported on 01/17/2019 10/20/18   Anders Simmonds, PA-C  lidocaine (LIDODERM) 5 % Place 1 patch onto the skin daily. Remove & Discard patch within 12 hours or as directed by MD Patient not taking: Reported on 01/17/2019 01/14/19   Henderly, Britni A, PA-C  methylPREDNISolone (MEDROL DOSEPAK) 4 MG TBPK tablet As directed Patient not taking: Reported on 01/17/2019 01/16/19   Loren Racer, MD  oxyCODONE-acetaminophen (PERCOCET) 5-325 MG tablet Take 1-2 tablets by mouth every 6 (six) hours as needed for severe pain. Patient not taking: Reported on 01/17/2019 01/16/19   Loren Racer, MD  rosuvastatin (CRESTOR) 10 MG tablet Take 1 tablet (10 mg total) by mouth daily. To lower cholesterol Patient not taking: Reported on 01/17/2019  12/09/18   Cain Saupe, MD    Family History Family History  Problem Relation Age of Onset  . Diabetes Mother   . Healthy Father     Social History Social History   Tobacco Use  . Smoking status: Current Every Day Smoker    Types: Cigarettes  . Smokeless tobacco: Never Used  Substance Use Topics  . Alcohol use: No  . Drug use: Never     Allergies   Patient has no known allergies.   Review of Systems Review of Systems  Constitutional: Negative for chills and fever.  Respiratory: Negative for shortness of breath.   Cardiovascular: Negative for chest pain.   Gastrointestinal: Negative for abdominal pain, nausea and vomiting.  Genitourinary: Negative for difficulty urinating.  Musculoskeletal: Positive for back pain and myalgias. Negative for neck pain.  Skin: Negative for rash and wound.  Neurological: Positive for weakness and numbness. Negative for dizziness, syncope, light-headedness and headaches.  All other systems reviewed and are negative.    Physical Exam Updated Vital Signs BP (!) 174/91   Pulse 65   Temp 98.5 F (36.9 C) (Oral)   Resp 18   Ht 5\' 8"  (1.727 m)   Wt 112.9 kg   LMP 01/16/2019   SpO2 97%   BMI 37.86 kg/m   Physical Exam Vitals signs and nursing note reviewed.  Constitutional:      General: She is in acute distress.     Appearance: Normal appearance. She is well-developed.  HENT:     Head: Normocephalic and atraumatic.     Mouth/Throat:     Mouth: Mucous membranes are moist.  Eyes:     Extraocular Movements: Extraocular movements intact.     Pupils: Pupils are equal, round, and reactive to light.  Neck:     Musculoskeletal: Normal range of motion and neck supple. No neck rigidity or muscular tenderness.  Cardiovascular:     Rate and Rhythm: Normal rate and regular rhythm.     Heart sounds: No murmur. No friction rub. No gallop.   Pulmonary:     Effort: Pulmonary effort is normal. No respiratory distress.     Breath sounds: Normal breath sounds. No stridor. No wheezing, rhonchi or rales.  Chest:     Chest wall: No tenderness.  Abdominal:     General: Bowel sounds are normal. There is no distension.     Palpations: Abdomen is soft. There is no mass.     Tenderness: There is no abdominal tenderness. There is no right CVA tenderness, left CVA tenderness, guarding or rebound.     Hernia: No hernia is present.  Musculoskeletal: Normal range of motion.        General: Tenderness present. No swelling, deformity or signs of injury.     Right lower leg: No edema.     Left lower leg: No edema.      Comments: Patient has inferior lumbar midline tenderness to palpation and palpation over bilateral paraspinal musculature.  2+ dorsalis pedis and posterior tibial pulses.  No lower extremity swelling, asymmetry or tenderness.  Lymphadenopathy:     Cervical: No cervical adenopathy.  Skin:    General: Skin is warm and dry.     Capillary Refill: Capillary refill takes less than 2 seconds.     Findings: No erythema or rash.  Neurological:     Mental Status: She is alert and oriented to person, place, and time.     Comments: Decrease sensation to light touch of the  entire left lower extremity.  She has diminished sensation to light touch over the medial surface of the right foot.  2/5 left foot dorsiflexion and plantarflexion.  5/5 right foot dorsiflexion plantarflexion.  Psychiatric:        Behavior: Behavior normal.      ED Treatments / Results  Labs (all labs ordered are listed, but only abnormal results are displayed) Labs Reviewed - No data to display  EKG None  Radiology Mr Lumbar Spine Wo Contrast  Result Date: 01/16/2019 CLINICAL DATA:  Initial evaluation for progressive left-sided low back pain for 1 week, extending into the left lower extremity. EXAM: MRI LUMBAR SPINE WITHOUT CONTRAST TECHNIQUE: Multiplanar, multisequence MR imaging of the lumbar spine was performed. No intravenous contrast was administered. COMPARISON:  Previous radiographs from 01/14/2019. FINDINGS: Segmentation: Standard. Lowest well-formed disc labeled the L5-S1 level. Alignment: Trace dextroscoliosis with straightening of the normal lumbar lordosis. Trace retrolisthesis of L3 on L4. Vertebrae: Vertebral body heights maintained without evidence for acute or chronic fracture. Bone marrow signal intensity within normal limits. No discrete or worrisome osseous lesions. Reactive endplate changes present about the L4-5 and L5-S1 interspaces. No other abnormal marrow edema. Conus medullaris and cauda equina: Conus  extends to the L1 level. Conus and cauda equina appear normal. Paraspinal and other soft tissues: Paraspinous soft tissues within normal limits. Visualized visceral structures are unremarkable. Disc levels: T11-12: Seen only on sagittal projection. Mild disc bulge with disc desiccation. Bilateral facet hypertrophy. No significant spinal stenosis. Mild right foraminal narrowing. T12-L1: Unremarkable. L1-2: Negative interspace. Bilateral facet hypertrophy with osteophytic spurring about the left L1-2 facet. Resultant mild left lateral recess and foraminal stenosis. Central canal remains patent. L2-3:  Unremarkable. L3-4: Diffuse disc bulge with disc desiccation and intervertebral disc space narrowing. Superimposed shallow central disc protrusion mildly indents the ventral thecal sac. Associated annular fissure. Mild facet and ligament flavum hypertrophy. Resultant mild canal with bilateral lateral recess stenosis, left slightly worse than right. Foramina remain patent. L4-5: Diffuse disc bulge with disc desiccation. Associated reactive discogenic endplate changes. Superimposed moderate-sized central disc protrusion indents the ventral thecal sac. Resultant severe canal and bilateral lateral recess stenosis. Foramina remain patent. L5-S1: Disc desiccation with broad-based central disc protrusion, mildly indenting the ventral thecal sac. Protruding disc contacts the descending S1 nerve roots in the lateral recesses without frank impingement. Mild bilateral lateral recess stenosis. Central canal remains patent. Mild bilateral foraminal stenosis. IMPRESSION: 1. Moderate sized central disc protrusion at L4-5 with resultant severe spinal stenosis, likely accounting for patient's symptoms. 2. Shallow central disc protrusion at L5-S1, closely approximating the descending S1 nerve roots without frank impingement. 3. Disc bulging with facet hypertrophy at L3-4 with resultant mild canal with left greater than right lateral  recess stenosis. Electronically Signed   By: Rise Mu M.D.   On: 01/16/2019 22:19   Dg Lumbar Spine 1 View  Result Date: 01/17/2019 CLINICAL DATA:  L4-5 discectomy. EXAM: LUMBAR SPINE - 1 VIEW COMPARISON:  None. FINDINGS: Localizing markers project over posterior elements at L5-S1. There is disc space narrowing from L3 through S1. No acute osseous appearing. IMPRESSION: Localizing markers project over the posterior elements at L5-S1. The ordering clinician is aware per study notes. Electronically Signed   By: Tollie Eth M.D.   On: 01/17/2019 22:44    Procedures Procedures (including critical care time)  Medications Ordered in ED Medications  HYDROmorphone (DILAUDID) injection 2 mg (2 mg Intramuscular Given 01/16/19 2030)  HYDROmorphone (DILAUDID) injection 2 mg (2 mg  Intramuscular Given 01/16/19 2249)  ibuprofen (ADVIL,MOTRIN) tablet 600 mg (600 mg Oral Given 01/16/19 2248)  methocarbamol (ROBAXIN) tablet 1,000 mg (1,000 mg Oral Given 01/16/19 2248)  ondansetron (ZOFRAN-ODT) disintegrating tablet 8 mg (8 mg Oral Given 01/16/19 2347)     Initial Impression / Assessment and Plan / ED Course  I have reviewed the triage vital signs and the nursing notes.  Pertinent labs & imaging results that were available during my care of the patient were reviewed by me and considered in my medical decision making (see chart for details).        Given rapidly progressive neurologic symptoms and pain will arrange to get MRI lumbar spine.  MRI lumbar spine with L4-5 disc space disease resulting in severe spinal stenosis.  Patient has moderate improvement of her pain after IM Dilaudid.  Discussed with Dr. Lovell Sheehan.  Suggest starting patient on Medrol Dosepak and follow-up closely in his office.  Will re-dose patient's pain medication and anticipate discharge home. Final Clinical Impressions(s) / ED Diagnoses   Final diagnoses:  Spinal stenosis of lumbar region, unspecified whether neurogenic  claudication present    ED Discharge Orders         Ordered    methylPREDNISolone (MEDROL DOSEPAK) 4 MG TBPK tablet     01/16/19 2315    oxyCODONE-acetaminophen (PERCOCET) 5-325 MG tablet  Every 6 hours PRN     01/16/19 2315           Loren Racer, MD 01/17/19 2252

## 2019-01-16 NOTE — Discharge Instructions (Addendum)
Continue taking ibuprofen and Robaxin as previously prescribed.  Follow-up closely with Dr. Lovell Sheehan.

## 2019-01-17 ENCOUNTER — Inpatient Hospital Stay (HOSPITAL_COMMUNITY): Payer: BLUE CROSS/BLUE SHIELD | Admitting: Certified Registered Nurse Anesthetist

## 2019-01-17 ENCOUNTER — Encounter (HOSPITAL_COMMUNITY): Payer: Self-pay | Admitting: Emergency Medicine

## 2019-01-17 ENCOUNTER — Encounter (HOSPITAL_COMMUNITY)
Admission: EM | Disposition: A | Discharge status: Rehabilitation Facility | Payer: Self-pay | Source: Home / Self Care | Attending: Neurosurgery

## 2019-01-17 ENCOUNTER — Inpatient Hospital Stay (HOSPITAL_COMMUNITY)
Admission: EM | Admit: 2019-01-17 | Discharge: 2019-01-21 | DRG: 519 | Disposition: A | Discharge status: Rehabilitation Facility | Payer: BLUE CROSS/BLUE SHIELD | Attending: Neurosurgery | Admitting: Neurosurgery

## 2019-01-17 ENCOUNTER — Inpatient Hospital Stay (HOSPITAL_COMMUNITY): Payer: BLUE CROSS/BLUE SHIELD

## 2019-01-17 DIAGNOSIS — Z833 Family history of diabetes mellitus: Secondary | ICD-10-CM | POA: Diagnosis not present

## 2019-01-17 DIAGNOSIS — Z79899 Other long term (current) drug therapy: Secondary | ICD-10-CM | POA: Diagnosis not present

## 2019-01-17 DIAGNOSIS — M5116 Intervertebral disc disorders with radiculopathy, lumbar region: Principal | ICD-10-CM | POA: Diagnosis present

## 2019-01-17 DIAGNOSIS — E669 Obesity, unspecified: Secondary | ICD-10-CM | POA: Diagnosis present

## 2019-01-17 DIAGNOSIS — G834 Cauda equina syndrome: Secondary | ICD-10-CM | POA: Diagnosis present

## 2019-01-17 DIAGNOSIS — M48061 Spinal stenosis, lumbar region without neurogenic claudication: Secondary | ICD-10-CM | POA: Diagnosis present

## 2019-01-17 DIAGNOSIS — R531 Weakness: Secondary | ICD-10-CM

## 2019-01-17 DIAGNOSIS — R29898 Other symptoms and signs involving the musculoskeletal system: Secondary | ICD-10-CM | POA: Diagnosis present

## 2019-01-17 DIAGNOSIS — E119 Type 2 diabetes mellitus without complications: Secondary | ICD-10-CM | POA: Diagnosis present

## 2019-01-17 DIAGNOSIS — Z7984 Long term (current) use of oral hypoglycemic drugs: Secondary | ICD-10-CM | POA: Diagnosis not present

## 2019-01-17 DIAGNOSIS — Z6837 Body mass index (BMI) 37.0-37.9, adult: Secondary | ICD-10-CM

## 2019-01-17 DIAGNOSIS — F17210 Nicotine dependence, cigarettes, uncomplicated: Secondary | ICD-10-CM | POA: Diagnosis present

## 2019-01-17 DIAGNOSIS — Z419 Encounter for procedure for purposes other than remedying health state, unspecified: Secondary | ICD-10-CM

## 2019-01-17 HISTORY — PX: LUMBAR LAMINECTOMY/DECOMPRESSION MICRODISCECTOMY: SHX5026

## 2019-01-17 LAB — CBC WITH DIFFERENTIAL/PLATELET
Abs Immature Granulocytes: 0.08 10*3/uL — ABNORMAL HIGH (ref 0.00–0.07)
BASOS ABS: 0 10*3/uL (ref 0.0–0.1)
Basophils Relative: 0 %
Eosinophils Absolute: 0 10*3/uL (ref 0.0–0.5)
Eosinophils Relative: 0 %
HCT: 44.2 % (ref 36.0–46.0)
Hemoglobin: 13.9 g/dL (ref 12.0–15.0)
Immature Granulocytes: 1 %
Lymphocytes Relative: 12 %
Lymphs Abs: 1.5 10*3/uL (ref 0.7–4.0)
MCH: 28.8 pg (ref 26.0–34.0)
MCHC: 31.4 g/dL (ref 30.0–36.0)
MCV: 91.5 fL (ref 80.0–100.0)
Monocytes Absolute: 0.5 10*3/uL (ref 0.1–1.0)
Monocytes Relative: 4 %
NRBC: 0 % (ref 0.0–0.2)
Neutro Abs: 10.2 10*3/uL — ABNORMAL HIGH (ref 1.7–7.7)
Neutrophils Relative %: 83 %
PLATELETS: 279 10*3/uL (ref 150–400)
RBC: 4.83 MIL/uL (ref 3.87–5.11)
RDW: 13.7 % (ref 11.5–15.5)
WBC: 12.3 10*3/uL — ABNORMAL HIGH (ref 4.0–10.5)

## 2019-01-17 LAB — COMPREHENSIVE METABOLIC PANEL
ALT: 14 U/L (ref 0–44)
AST: 15 U/L (ref 15–41)
Albumin: 4.1 g/dL (ref 3.5–5.0)
Alkaline Phosphatase: 65 U/L (ref 38–126)
Anion gap: 9 (ref 5–15)
BUN: 31 mg/dL — ABNORMAL HIGH (ref 6–20)
CO2: 22 mmol/L (ref 22–32)
Calcium: 9.5 mg/dL (ref 8.9–10.3)
Chloride: 107 mmol/L (ref 98–111)
Creatinine, Ser: 1.37 mg/dL — ABNORMAL HIGH (ref 0.44–1.00)
GFR calc Af Amer: 54 mL/min — ABNORMAL LOW (ref >60)
GFR calc non Af Amer: 46 mL/min — ABNORMAL LOW (ref >60)
Glucose, Bld: 142 mg/dL — ABNORMAL HIGH (ref 70–99)
Potassium: 4.5 mmol/L (ref 3.5–5.1)
Sodium: 138 mmol/L (ref 135–145)
Total Bilirubin: 0.6 mg/dL (ref 0.3–1.2)
Total Protein: 7.7 g/dL (ref 6.5–8.1)

## 2019-01-17 LAB — GLUCOSE, CAPILLARY: Glucose-Capillary: 122 mg/dL — ABNORMAL HIGH (ref 70–99)

## 2019-01-17 SURGERY — LUMBAR LAMINECTOMY/DECOMPRESSION MICRODISCECTOMY 1 LEVEL
Anesthesia: General | Site: Spine Lumbar | Laterality: Right

## 2019-01-17 MED ORDER — 0.9 % SODIUM CHLORIDE (POUR BTL) OPTIME
TOPICAL | Status: DC | PRN
  Administered 2019-01-17: 1000 mL

## 2019-01-17 MED ORDER — MIDAZOLAM HCL 2 MG/2ML IJ SOLN
INTRAMUSCULAR | Status: DC | PRN
  Administered 2019-01-17 (×2): 1 mg via INTRAVENOUS

## 2019-01-17 MED ORDER — LACTATED RINGERS IV SOLN: INTRAVENOUS | Status: DC

## 2019-01-17 MED ORDER — PROMETHAZINE HCL 25 MG/ML IJ SOLN: 6.2500 mg | INTRAMUSCULAR | Status: DC | PRN

## 2019-01-17 MED ORDER — THROMBIN 5000 UNITS EX SOLR
CUTANEOUS | Status: DC | PRN
  Administered 2019-01-17 (×2): 5000 [IU] via TOPICAL

## 2019-01-17 MED ORDER — ONDANSETRON HCL 4 MG/2ML IJ SOLN
4.0000 mg | Freq: Once | INTRAMUSCULAR | Status: AC
  Administered 2019-01-17: 4 mg via INTRAVENOUS
  Filled 2019-01-17: qty 2

## 2019-01-17 MED ORDER — SODIUM CHLORIDE 0.9 % IV SOLN
INTRAVENOUS | Status: DC | PRN
  Administered 2019-01-17: 22:00:00

## 2019-01-17 MED ORDER — ROCURONIUM BROMIDE 10 MG/ML (PF) SYRINGE
PREFILLED_SYRINGE | INTRAVENOUS | Status: DC | PRN
  Administered 2019-01-17: 50 mg via INTRAVENOUS
  Administered 2019-01-17: 10 mg via INTRAVENOUS

## 2019-01-17 MED ORDER — SUGAMMADEX SODIUM 200 MG/2ML IV SOLN
INTRAVENOUS | Status: DC | PRN
  Administered 2019-01-17: 300 mg via INTRAVENOUS

## 2019-01-17 MED ORDER — PROPOFOL 10 MG/ML IV BOLUS
INTRAVENOUS | Status: DC | PRN
  Administered 2019-01-17: 170 mg via INTRAVENOUS

## 2019-01-17 MED ORDER — FENTANYL CITRATE (PF) 250 MCG/5ML IJ SOLN
INTRAMUSCULAR | Status: DC | PRN
  Administered 2019-01-17 (×2): 50 ug via INTRAVENOUS
  Administered 2019-01-17: 150 ug via INTRAVENOUS

## 2019-01-17 MED ORDER — DEXAMETHASONE SODIUM PHOSPHATE 10 MG/ML IJ SOLN
INTRAMUSCULAR | Status: DC | PRN
  Administered 2019-01-17: 10 mg via INTRAVENOUS

## 2019-01-17 MED ORDER — DEXTROSE 5 % IV SOLN
INTRAVENOUS | Status: DC | PRN
  Administered 2019-01-17: 3 g via INTRAVENOUS

## 2019-01-17 MED ORDER — ACETAMINOPHEN 160 MG/5ML PO SOLN: 325.0000 mg | Freq: Once | ORAL | Status: DC

## 2019-01-17 MED ORDER — HYDROMORPHONE HCL 1 MG/ML IJ SOLN: 0.2500 mg | INTRAMUSCULAR | Status: DC | PRN

## 2019-01-17 MED ORDER — CEFAZOLIN SODIUM-DEXTROSE 1-4 GM/50ML-% IV SOLN
1.0000 g | Freq: Three times a day (TID) | INTRAVENOUS | Status: AC
  Administered 2019-01-18 (×2): 1 g via INTRAVENOUS
  Filled 2019-01-17 (×2): qty 50

## 2019-01-17 MED ORDER — HEMOSTATIC AGENTS (NO CHARGE) OPTIME
TOPICAL | Status: DC | PRN
  Administered 2019-01-17: 1 via TOPICAL

## 2019-01-17 MED ORDER — KETOROLAC TROMETHAMINE 30 MG/ML IJ SOLN
INTRAMUSCULAR | Status: DC | PRN
  Administered 2019-01-17: 30 mg via INTRAVENOUS

## 2019-01-17 MED ORDER — MEPERIDINE HCL 50 MG/ML IJ SOLN: 6.2500 mg | INTRAMUSCULAR | Status: DC | PRN

## 2019-01-17 MED ORDER — ONDANSETRON HCL 4 MG/2ML IJ SOLN
INTRAMUSCULAR | Status: DC | PRN
  Administered 2019-01-17: 4 mg via INTRAVENOUS

## 2019-01-17 MED ORDER — ACETAMINOPHEN 10 MG/ML IV SOLN
INTRAVENOUS | Status: AC
  Filled 2019-01-17: qty 100

## 2019-01-17 MED ORDER — CYCLOBENZAPRINE HCL 10 MG PO TABS
10.0000 mg | ORAL_TABLET | Freq: Three times a day (TID) | ORAL | Status: DC | PRN
  Administered 2019-01-18 – 2019-01-20 (×6): 10 mg via ORAL
  Filled 2019-01-17 (×6): qty 1

## 2019-01-17 MED ORDER — ACETAMINOPHEN 325 MG PO TABS: 325.0000 mg | ORAL_TABLET | Freq: Once | ORAL | Status: DC

## 2019-01-17 MED ORDER — SODIUM CHLORIDE 0.9 % IV BOLUS
1000.0000 mL | Freq: Once | INTRAVENOUS | Status: AC
  Administered 2019-01-17: 1000 mL via INTRAVENOUS

## 2019-01-17 MED ORDER — HYDROMORPHONE HCL 1 MG/ML IJ SOLN
1.0000 mg | Freq: Once | INTRAMUSCULAR | Status: AC
  Administered 2019-01-17: 1 mg via INTRAVENOUS
  Filled 2019-01-17: qty 1

## 2019-01-17 MED ORDER — LACTATED RINGERS IV SOLN
INTRAVENOUS | Status: DC | PRN
  Administered 2019-01-17: 22:00:00 via INTRAVENOUS

## 2019-01-17 MED ORDER — ACETAMINOPHEN 10 MG/ML IV SOLN
INTRAVENOUS | Status: DC | PRN
  Administered 2019-01-17: 1000 mg via INTRAVENOUS

## 2019-01-17 MED ORDER — ACETAMINOPHEN 10 MG/ML IV SOLN: 1000.0000 mg | Freq: Once | INTRAVENOUS | Status: DC | PRN

## 2019-01-17 MED ORDER — LIDOCAINE 2% (20 MG/ML) 5 ML SYRINGE
INTRAMUSCULAR | Status: DC | PRN
  Administered 2019-01-17: 60 mg via INTRAVENOUS

## 2019-01-17 SURGICAL SUPPLY — 51 items
ADH SKN CLS APL DERMABOND .7 (GAUZE/BANDAGES/DRESSINGS) ×1
APL SKNCLS STERI-STRIP NONHPOA (GAUZE/BANDAGES/DRESSINGS) ×1
BAG DECANTER FOR FLEXI CONT (MISCELLANEOUS) ×3 IMPLANT
BENZOIN TINCTURE PRP APPL 2/3 (GAUZE/BANDAGES/DRESSINGS) ×3 IMPLANT
BUR CUTTER 7.0 ROUND (BURR) ×3 IMPLANT
CANISTER SUCT 3000ML PPV (MISCELLANEOUS) ×3 IMPLANT
CARTRIDGE OIL MAESTRO DRILL (MISCELLANEOUS) ×1 IMPLANT
CLOSURE WOUND 1/2 X4 (GAUZE/BANDAGES/DRESSINGS) ×1
DERMABOND ADVANCED (GAUZE/BANDAGES/DRESSINGS) ×2
DERMABOND ADVANCED .7 DNX12 (GAUZE/BANDAGES/DRESSINGS) ×1 IMPLANT
DIFFUSER DRILL AIR PNEUMATIC (MISCELLANEOUS) ×3 IMPLANT
DRAPE HALF SHEET 40X57 (DRAPES) IMPLANT
DRAPE LAPAROTOMY 100X72X124 (DRAPES) ×3 IMPLANT
DRAPE MICROSCOPE LEICA (MISCELLANEOUS) ×3 IMPLANT
DRAPE SURG 17X23 STRL (DRAPES) ×6 IMPLANT
DRSG OPSITE POSTOP 3X4 (GAUZE/BANDAGES/DRESSINGS) ×2 IMPLANT
DURAPREP 26ML APPLICATOR (WOUND CARE) ×3 IMPLANT
ELECT REM PT RETURN 9FT ADLT (ELECTROSURGICAL) ×3
ELECTRODE REM PT RTRN 9FT ADLT (ELECTROSURGICAL) ×1 IMPLANT
GAUZE 4X4 16PLY RFD (DISPOSABLE) IMPLANT
GAUZE SPONGE 4X4 12PLY STRL (GAUZE/BANDAGES/DRESSINGS) ×1 IMPLANT
GLOVE BIO SURGEON STRL SZ 6.5 (GLOVE) ×2 IMPLANT
GLOVE BIO SURGEON STRL SZ7 (GLOVE) ×4 IMPLANT
GLOVE BIO SURGEONS STRL SZ 6.5 (GLOVE) ×2
GLOVE BIOGEL PI IND STRL 6.5 (GLOVE) IMPLANT
GLOVE BIOGEL PI IND STRL 7.5 (GLOVE) IMPLANT
GLOVE BIOGEL PI INDICATOR 6.5 (GLOVE) ×4
GLOVE BIOGEL PI INDICATOR 7.5 (GLOVE) ×4
GLOVE ECLIPSE 9.0 STRL (GLOVE) ×3 IMPLANT
GOWN STRL REUS W/ TWL LRG LVL3 (GOWN DISPOSABLE) IMPLANT
GOWN STRL REUS W/ TWL XL LVL3 (GOWN DISPOSABLE) ×1 IMPLANT
GOWN STRL REUS W/TWL 2XL LVL3 (GOWN DISPOSABLE) IMPLANT
GOWN STRL REUS W/TWL LRG LVL3 (GOWN DISPOSABLE) ×6
GOWN STRL REUS W/TWL XL LVL3 (GOWN DISPOSABLE) ×3
KIT BASIN OR (CUSTOM PROCEDURE TRAY) ×3 IMPLANT
KIT TURNOVER KIT B (KITS) ×3 IMPLANT
NDL SPNL 22GX3.5 QUINCKE BK (NEEDLE) IMPLANT
NEEDLE HYPO 22GX1.5 SAFETY (NEEDLE) ×3 IMPLANT
NEEDLE SPNL 22GX3.5 QUINCKE BK (NEEDLE) ×3 IMPLANT
NS IRRIG 1000ML POUR BTL (IV SOLUTION) ×3 IMPLANT
OIL CARTRIDGE MAESTRO DRILL (MISCELLANEOUS) ×3
PACK LAMINECTOMY NEURO (CUSTOM PROCEDURE TRAY) ×3 IMPLANT
PAD ARMBOARD 7.5X6 YLW CONV (MISCELLANEOUS) ×9 IMPLANT
RUBBERBAND STERILE (MISCELLANEOUS) ×6 IMPLANT
SPONGE SURGIFOAM ABS GEL SZ50 (HEMOSTASIS) ×3 IMPLANT
STRIP CLOSURE SKIN 1/2X4 (GAUZE/BANDAGES/DRESSINGS) ×2 IMPLANT
SUT VIC AB 2-0 CT1 18 (SUTURE) ×5 IMPLANT
SUT VIC AB 3-0 SH 8-18 (SUTURE) ×3 IMPLANT
TOWEL GREEN STERILE (TOWEL DISPOSABLE) ×3 IMPLANT
TOWEL GREEN STERILE FF (TOWEL DISPOSABLE) ×3 IMPLANT
WATER STERILE IRR 1000ML POUR (IV SOLUTION) ×3 IMPLANT

## 2019-01-17 NOTE — Anesthesia Procedure Notes (Signed)
Procedure Name: Intubation Date/Time: 01/17/2019 9:57 PM Performed by: Clearnce Sorrel, CRNA Pre-anesthesia Checklist: Patient identified, Emergency Drugs available, Suction available, Patient being monitored and Timeout performed Patient Re-evaluated:Patient Re-evaluated prior to induction Oxygen Delivery Method: Circle system utilized Preoxygenation: Pre-oxygenation with 100% oxygen Induction Type: IV induction Ventilation: Mask ventilation without difficulty Laryngoscope Size: Mac and 3 Grade View: Grade I Tube type: Oral Tube size: 7.0 mm Number of attempts: 1 Airway Equipment and Method: Stylet Placement Confirmation: ETT inserted through vocal cords under direct vision,  positive ETCO2 and breath sounds checked- equal and bilateral Secured at: 22 cm Tube secured with: Tape Dental Injury: Teeth and Oropharynx as per pre-operative assessment

## 2019-01-17 NOTE — ED Provider Notes (Signed)
[facility]-EMERGENCY DEPT Provider Note   CSN: [id number] Arrival date & time: 01/17/19  1313    History   Chief Complaint Chief Complaint  Patient presents with  . Back Pain  . Leg Pain    HPI Danielle Moreno is a 43 y.o. female.     HPI  Danielle Moreno is a 43 y.o. female, with a history of DM, presenting to the ED with back pain and lower extremity weakness. States she began to have back pain about 8 days ago.  About 5 days ago she began to have numbness and weakness in the left leg.  Yesterday, she began to have weakness and numbness in the right leg. This morning after waking around 10 AM, she noted she had lost complete ability to ambulate.  Furthermore, she noted numbness in her groin.   She was able to urinate this morning.  She has not had a bowel movement for several days. Her last food was this morning shortly after 10 AM. Denies fever/chills, IV drug use, abdominal pain, shortness of breath, chest pain, falls/trauma, changes in urinary function, or any other complaints.    Past Medical History:  Diagnosis Date  . Diabetes mellitus without complication [facility])     Patient Active Problem List   Diagnosis Date Noted  . 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 Type 2 diabetes mellitus without complication, without long-term current use of insulin (HCC) 11/22/2018  . Lumbar spondylosis 11/22/2018    Past Surgical History:  Procedure Laterality Date  . NO PAST SURGERIES       OB History   No obstetric history on file.      Home Medications    Prior to Admission medications   Medication Sig Start Date End Date Taking? Authorizing Provider  cyclobenzaprine (FLEXERIL) 5 MG tablet Take 1-2 tablets (5-10 mg total) by mouth 2 (two) times daily as needed for muscle spasms. 01/13/19  Yes Wieters, Hallie C, PA-C  ibuprofen (ADVIL,MOTRIN) 600 MG tablet Take 1 tablet (600 mg total) by mouth every 6 (six) hours as needed. Patient  taking differently: Take 600 mg by mouth every 6 (six) hours as needed for headache, mild pain or moderate pain.  01/13/19  Yes Wieters, Hallie C, PA-C  metFORMIN (GLUCOPHAGE) 500 MG tablet Take 1 tablet (500 mg total) by mouth 2 (two) times daily with a meal. 10/20/18  Yes McClung, Angela M, PA-C  methocarbamol (ROBAXIN) 500 MG tablet Take 1 tablet (500 mg total) by mouth 2 (two) times daily. Patient taking differently: Take 500 mg by mouth 2 (two) times daily as needed for muscle spasms.  01/11/19  Yes Mardella Layman, MD  naproxen (NAPROSYN) 500 MG tablet Take 1 tablet (500 mg total) by mouth 2 (two) times daily. 01/14/19  Yes Henderly, Britni A, PA-C  predniSONE (DELTASONE) 50 MG tablet Take 1 tablet (50 mg total) by mouth daily for 5 days. Start on 2/28 01/13/19 01/18/19 Yes Wieters, Hallie C, PA-C  diclofenac sodium (VOLTAREN) 1 % GEL Apply 2 g topically 4 (four) times daily. Patient not taking: Reported on 01/17/2019 10/20/18   Anders Simmonds, PA-C  lidocaine (LIDODERM) 5 % Place 1 patch onto the skin daily. Remove & Discard patch within 12 hours or as directed by MD Patient not taking: Reported on 01/17/2019 01/14/19   Henderly, Britni A, PA-C  methylPREDNISolone (MEDROL DOSEPAK) 4 MG TBPK tablet As directed Patient not taking: Reported on 01/17/2019 01/16/19   Loren Racer, MD  oxyCODONE-acetaminophen (PERCOCET) 5-325 MG tablet Take 1-2 tablets by mouth every 6 (six) hours as needed for severe pain. Patient not taking: Reported on 01/17/2019 01/16/19   Loren Racer, MD  rosuvastatin (CRESTOR) 10 MG tablet Take 1 tablet (10 mg total) by mouth daily. To lower cholesterol Patient not taking: Reported on 01/17/2019 12/09/18   Cain Saupe, MD    Family History Family History  Problem Relation Age of Onset  . Diabetes Mother   . Healthy Father     Social History Social History   Tobacco Use  . Smoking status: Current Every Day Smoker    Types: Cigarettes  . Smokeless tobacco: Never Used    Substance Use Topics  . Alcohol use: No  . Drug use: Never     Allergies   Patient has no known allergies.   Review of Systems Review of Systems  Constitutional: Negative for chills, diaphoresis and fever.  Respiratory: Negative for shortness of breath.   Cardiovascular: Negative for chest pain.  Gastrointestinal: Negative for abdominal pain, diarrhea, nausea and vomiting.  Genitourinary: Negative for difficulty urinating.  Musculoskeletal: Positive for back pain.  Neurological: Positive for weakness and numbness.  All other systems reviewed and are negative.    Physical Exam Updated Vital Signs BP (!) 150/84 (BP Location: Right Arm)   Pulse 72   Temp 98.6 F (37 C) (Oral)   Resp 18   Ht 5\' 8"  (1.727 m)   Wt 112.9 kg   LMP 01/16/2019   SpO2 100%   BMI 37.86 kg/m   Physical Exam Vitals signs and nursing note reviewed.  Constitutional:      General: She is not in acute distress.    Appearance: She is well-developed. She is not diaphoretic.  HENT:     Head: Normocephalic and atraumatic.     Mouth/Throat:     Mouth: Mucous membranes are moist.     Pharynx: Oropharynx is clear.  Eyes:     Conjunctiva/sclera: Conjunctivae normal.  Neck:     Musculoskeletal: Neck supple.  Cardiovascular:     Rate and Rhythm: Normal rate and regular rhythm.     Pulses: Normal pulses.          Radial pulses are 2+ on the right side and 2+ on the left side.       Dorsalis pedis pulses are 2+ on the right side and 2+ on the left side.       Posterior tibial pulses are 2+ on the right side and 2+ on the left side.     Heart sounds: Normal heart sounds.     Comments: Tactile temperature in the extremities appropriate and equal bilaterally. Pulmonary:     Effort: Pulmonary effort is normal. No respiratory distress.     Breath sounds: Normal breath sounds.  Abdominal:     Palpations: Abdomen is soft.     Tenderness: There is no abdominal tenderness. There is no guarding.   Genitourinary:    Comments: No notable rectal tone. No perianal sensation. Patient passed gas immediately prior to my rectal exam and states, "Was that me? I heard it, but I didn't feel it."  Sensation to light touch grossly intact in the suprapubic region, mons pubis, inguinal region bilaterally, and labia.  RN, Lamar Laundry, served as Biomedical engineer during exam. Musculoskeletal:     Right lower leg: No edema.     Left lower leg: No edema.  Lymphadenopathy:     Cervical: No cervical adenopathy.  Skin:  General: Skin is warm and dry.     Capillary Refill: Capillary refill takes less than 2 seconds.  Neurological:     Mental Status: She is alert.     Deep Tendon Reflexes:     Reflex Scores:      Patellar reflexes are 2+ on the right side and 2+ on the left side.      Achilles reflexes are 0 on the right side and 0 on the left side.    Comments: Strength is 0/5 in the bilateral ankles and loss of sensation in the bilateral feet, dorsal, lateral, and medial. Strength 2/5 in the bilateral knees.  Decreased sensation in the bilateral lower legs. Strength 1/5 in bilateral hips. She has no notable rectal tone.  No perianal sensation. RN present as Biomedical engineer.  Psychiatric:        Mood and Affect: Mood and affect normal.        Speech: Speech normal.        Behavior: Behavior normal.      ED Treatments / Results  Labs (all labs ordered are listed, but only abnormal results are displayed) Labs Reviewed  COMPREHENSIVE METABOLIC PANEL - Abnormal; Notable for the following components:      Result Value   Glucose, Bld 142 (*)    BUN 31 (*)    Creatinine, Ser 1.37 (*)    GFR calc non Af Amer 46 (*)    GFR calc Af Amer 54 (*)    All other components within normal limits  CBC WITH DIFFERENTIAL/PLATELET - Abnormal; Notable for the following components:   WBC 12.3 (*)    Neutro Abs 10.2 (*)    Abs Immature Granulocytes 0.08 (*)    All other components within normal limits     EKG None  Radiology Mr Lumbar Spine Wo Contrast  Result Date: 01/16/2019 CLINICAL DATA:  Initial evaluation for progressive left-sided low back pain for 1 week, extending into the left lower extremity. EXAM: MRI LUMBAR SPINE WITHOUT CONTRAST TECHNIQUE: Multiplanar, multisequence MR imaging of the lumbar spine was performed. No intravenous contrast was administered. COMPARISON:  Previous radiographs from 01/14/2019. FINDINGS: Segmentation: Standard. Lowest well-formed disc labeled the L5-S1 level. Alignment: Trace dextroscoliosis with straightening of the normal lumbar lordosis. Trace retrolisthesis of L3 on L4. Vertebrae: Vertebral body heights maintained without evidence for acute or chronic fracture. Bone marrow signal intensity within normal limits. No discrete or worrisome osseous lesions. Reactive endplate changes present about the L4-5 and L5-S1 interspaces. No other abnormal marrow edema. Conus medullaris and cauda equina: Conus extends to the L1 level. Conus and cauda equina appear normal. Paraspinal and other soft tissues: Paraspinous soft tissues within normal limits. Visualized visceral structures are unremarkable. Disc levels: T11-12: Seen only on sagittal projection. Mild disc bulge with disc desiccation. Bilateral facet hypertrophy. No significant spinal stenosis. Mild right foraminal narrowing. T12-L1: Unremarkable. L1-2: Negative interspace. Bilateral facet hypertrophy with osteophytic spurring about the left L1-2 facet. Resultant mild left lateral recess and foraminal stenosis. Central canal remains patent. L2-3:  Unremarkable. L3-4: Diffuse disc bulge with disc desiccation and intervertebral disc space narrowing. Superimposed shallow central disc protrusion mildly indents the ventral thecal sac. Associated annular fissure. Mild facet and ligament flavum hypertrophy. Resultant mild canal with bilateral lateral recess stenosis, left slightly worse than right. Foramina remain patent. L4-5:  Diffuse disc bulge with disc desiccation. Associated reactive discogenic endplate changes. Superimposed moderate-sized central disc protrusion indents the ventral thecal sac. Resultant severe canal and bilateral lateral recess  stenosis. Foramina remain patent. L5-S1: Disc desiccation with broad-based central disc protrusion, mildly indenting the ventral thecal sac. Protruding disc contacts the descending S1 nerve roots in the lateral recesses without frank impingement. Mild bilateral lateral recess stenosis. Central canal remains patent. Mild bilateral foraminal stenosis. IMPRESSION: 1. Moderate sized central disc protrusion at L4-5 with resultant severe spinal stenosis, likely accounting for patient's symptoms. 2. Shallow central disc protrusion at L5-S1, closely approximating the descending S1 nerve roots without frank impingement. 3. Disc bulging with facet hypertrophy at L3-4 with resultant mild canal with left greater than right lateral recess stenosis. Electronically Signed   By: Rise Mu M.D.   On: 01/16/2019 22:19    Procedures .Critical Care Performed by: Anselm Pancoast, PA-C Authorized by: Anselm Pancoast, PA-C   Critical care provider statement:    Critical care time (minutes):  35   Critical care time was exclusive of:  Separately billable procedures and treating other patients   Critical care was necessary to treat or prevent imminent or life-threatening deterioration of the following conditions:  CNS failure or compromise   Critical care was time spent personally by me on the following activities:  Discussions with consultants, examination of patient, obtaining history from patient or surrogate, ordering and review of radiographic studies, ordering and review of laboratory studies, pulse oximetry and re-evaluation of patient's condition   I assumed direction of critical care for this patient from another provider in my specialty: no     (including critical care  time)  Medications Ordered in ED Medications  sodium chloride 0.9 % bolus 1,000 mL (has no administration in time range)  HYDROmorphone (DILAUDID) injection 1 mg (1 mg Intravenous Given 01/17/19 1748)  ondansetron (ZOFRAN) injection 4 mg (4 mg Intravenous Given 01/17/19 1748)     Initial Impression / Assessment and Plan / ED Course  I have reviewed the triage vital signs and the nursing notes.  Pertinent labs & imaging results that were available during my care of the patient were reviewed by me and considered in my medical decision making (see chart for details).  Clinical Course as of Jan 16 1829  Mon Jan 17, 2019  1654 Spoke with Dr. Jordan Likes, neurosurgeon.  States patient will likely need surgery.  Requests we place admission orders for MedSurg bed at [facility] and initiate transfer.  Keep patient n.p.o.  Denies need for additional interventions at this time.   [SJ]    Clinical Course User Index [SJ] Joy, Shawn C, PA-C       Patient presents with back pain, weakness, and numbness.  Physical exam findings are concerning for neurologic compromise, though some of the exam findings are not completely consistent.  She is to be admitted to [facility] and evaluated by neurosurgery.  Findings and plan of care discussed with Linwood Dibbles, MD.   Vitals:   01/17/19 1630 01/17/19 1700 01/17/19 1730 01/17/19 1830  BP:      Pulse: 68 70 78 66  Resp:      Temp:      TempSrc:      SpO2: 99% 100% 100% 95%  Weight:      Height:         Final Clinical Impressions(s) / ED Diagnoses   Final diagnoses:  Weakness of both lower extremities    ED Discharge Orders    None       Concepcion Living 01/17/19 1831    Linwood Dibbles, MD 01/17/19 [id number]

## 2019-01-17 NOTE — Brief Op Note (Signed)
01/17/2019  11:15 PM  PATIENT:  Tonye Pearson Larsson  43 y.o. female  PRE-OPERATIVE DIAGNOSIS:  HNP  POST-OPERATIVE DIAGNOSIS:  HNP  PROCEDURE:  Procedure(s): RIGHT LUMBAR FOUR-FIVE MICRODISCECTOMY (Right)  SURGEON:  Surgeon(s) and Role:    * Julio Sicks, MD - Primary  PHYSICIAN ASSISTANT:   ASSISTANTSMarland Mcalpine   ANESTHESIA:   general  EBL:  50 mL   BLOOD ADMINISTERED:none  DRAINS: none   LOCAL MEDICATIONS USED:  MARCAINE     SPECIMEN:  No Specimen  DISPOSITION OF SPECIMEN:  N/A  COUNTS:  YES  TOURNIQUET:  * No tourniquets in log *  DICTATION: .Dragon Dictation  PLAN OF CARE: Admit to inpatient   PATIENT DISPOSITION:  PACU - hemodynamically stable.   Delay start of Pharmacological VTE agent (>24hrs) due to surgical blood loss or risk of bleeding: yes

## 2019-01-17 NOTE — H&P (Signed)
Danielle Moreno is an 43 y.o. female.   Chief Complaint: Weakness HPI: 43 year old female presents with bilateral distal lower extremity weakness.  Patient with severe back pain for 4 to 5 days.  No history of significant precipitating event.  Patient reports that over the past 24 hours she has developed increasing sensory loss and weakness in both distal lower extremities.  She has lost feeling in her perineal region and is incontinent of urine.  Work-up demonstrates evidence for large right-sided L4-5 paracentral disc herniation with severe stenosis.  Patient presents now for emergency surgery.  Past Medical History:  Diagnosis Date  . Diabetes mellitus without complication [facility])     Past Surgical History:  Procedure Laterality Date  . NO PAST SURGERIES      Family History  Problem Relation Age of Onset  . Diabetes Mother   . Healthy Father    Social History:  reports that she has been smoking cigarettes. She has never used smokeless tobacco. She reports that she does not drink alcohol or use drugs.  Allergies: No Known Allergies  (Not in a hospital admission)   Results for orders placed or performed during the hospital encounter of 01/17/19 (from the past 48 hour(s))  Comprehensive metabolic panel     Status: Abnormal   Collection Time: 01/17/19  5:41 PM  Result Value Ref Range   Sodium 138 135 - 145 mmol/L   Potassium 4.5 3.5 - 5.1 mmol/L   Chloride 107 98 - 111 mmol/L   CO2 22 22 - 32 mmol/L   Glucose, Bld 142 (H) 70 - 99 mg/dL   BUN 31 (H) 6 - 20 mg/dL   Creatinine, Ser 0.92 (H) 0.44 - 1.00 mg/dL   Calcium 9.5 8.9 - 95.7 mg/dL   Total Protein 7.7 6.5 - 8.1 g/dL   Albumin 4.1 3.5 - 5.0 g/dL   AST 15 15 - 41 U/L   ALT 14 0 - 44 U/L   Alkaline Phosphatase 65 38 - 126 U/L   Total Bilirubin 0.6 0.3 - 1.2 mg/dL   GFR calc non Af Amer 46 (L) >60 mL/min   GFR calc Af Amer 54 (L) >60 mL/min   Anion gap 9 5 - 15    Comment: Performed at [facility],  [address] [place], [place] [address]  CBC with Differential     Status: Abnormal   Collection Time: 01/17/19  5:41 PM  Result Value Ref Range   WBC 12.3 (H) 4.0 - 10.5 K/uL   RBC 4.83 3.87 - 5.11 MIL/uL   Hemoglobin 13.9 12.0 - 15.0 g/dL   HCT 37.0 96.4 - 38.3 %   MCV 91.5 80.0 - 100.0 fL   MCH 28.8 26.0 - 34.0 pg   MCHC 31.4 30.0 - 36.0 g/dL   RDW 81.8 40.3 - 75.4 %   Platelets 279 150 - 400 K/uL   nRBC 0.0 0.0 - 0.2 %   Neutrophils Relative % 83 %   Neutro Abs 10.2 (H) 1.7 - 7.7 K/uL   Lymphocytes Relative 12 %   Lymphs Abs 1.5 0.7 - 4.0 K/uL   Monocytes Relative 4 %   Monocytes Absolute 0.5 0.1 - 1.0 K/uL   Eosinophils Relative 0 %   Eosinophils Absolute 0.0 0.0 - 0.5 K/uL   Basophils Relative 0 %   Basophils Absolute 0.0 0.0 - 0.1 K/uL   Immature Granulocytes 1 %   Abs Immature Granulocytes 0.08 (H) 0.00 - 0.07 K/uL  Comment: Performed at [facility], [address], [place], [place] [address]   Mr Lumbar Spine Wo Contrast  Result Date: 01/16/2019 CLINICAL DATA:  Initial evaluation for progressive left-sided low back pain for 1 week, extending into the left lower extremity. EXAM: MRI LUMBAR SPINE WITHOUT CONTRAST TECHNIQUE: Multiplanar, multisequence MR imaging of the lumbar spine was performed. No intravenous contrast was administered. COMPARISON:  Previous radiographs from 01/14/2019. FINDINGS: Segmentation: Standard. Lowest well-formed disc labeled the L5-S1 level. Alignment: Trace dextroscoliosis with straightening of the normal lumbar lordosis. Trace retrolisthesis of L3 on L4. Vertebrae: Vertebral body heights maintained without evidence for acute or chronic fracture. Bone marrow signal intensity within normal limits. No discrete or worrisome osseous lesions. Reactive endplate changes present about the L4-5 and L5-S1 interspaces. No other abnormal marrow edema. Conus medullaris and cauda equina: Conus extends to the L1 level. Conus and cauda equina  appear normal. Paraspinal and other soft tissues: Paraspinous soft tissues within normal limits. Visualized visceral structures are unremarkable. Disc levels: T11-12: Seen only on sagittal projection. Mild disc bulge with disc desiccation. Bilateral facet hypertrophy. No significant spinal stenosis. Mild right foraminal narrowing. T12-L1: Unremarkable. L1-2: Negative interspace. Bilateral facet hypertrophy with osteophytic spurring about the left L1-2 facet. Resultant mild left lateral recess and foraminal stenosis. Central canal remains patent. L2-3:  Unremarkable. L3-4: Diffuse disc bulge with disc desiccation and intervertebral disc space narrowing. Superimposed shallow central disc protrusion mildly indents the ventral thecal sac. Associated annular fissure. Mild facet and ligament flavum hypertrophy. Resultant mild canal with bilateral lateral recess stenosis, left slightly worse than right. Foramina remain patent. L4-5: Diffuse disc bulge with disc desiccation. Associated reactive discogenic endplate changes. Superimposed moderate-sized central disc protrusion indents the ventral thecal sac. Resultant severe canal and bilateral lateral recess stenosis. Foramina remain patent. L5-S1: Disc desiccation with broad-based central disc protrusion, mildly indenting the ventral thecal sac. Protruding disc contacts the descending S1 nerve roots in the lateral recesses without frank impingement. Mild bilateral lateral recess stenosis. Central canal remains patent. Mild bilateral foraminal stenosis. IMPRESSION: 1. Moderate sized central disc protrusion at L4-5 with resultant severe spinal stenosis, likely accounting for patient's symptoms. 2. Shallow central disc protrusion at L5-S1, closely approximating the descending S1 nerve roots without frank impingement. 3. Disc bulging with facet hypertrophy at L3-4 with resultant mild canal with left greater than right lateral recess stenosis. Electronically Signed   By: Rise Mu M.D.   On: 01/16/2019 22:19    Pertinent items noted in HPI and remainder of comprehensive ROS otherwise negative.  Blood pressure (!) 169/83, pulse 66, temperature 98.6 F (37 C), temperature source Oral, resp. rate 14, height 5\' 8"  (1.727 m), weight 112.9 kg, last menstrual period 01/16/2019, SpO2 99 %.  Patient is awake and alert.  She is oriented and appropriate.  Speech is fluent.  Judgment insight are intact.  Cranial nerve function normal bilateral.  Motor and sensory function of the upper extremities normal.  Motor examination of the lower extremities reveals normal hip flexors and knee flexors and extensors.  Patient with complete loss of dorsiflexion and plantar flexion bilaterally.  Patient with sensory loss from L4 distally.  Sacral sensation absent.  Foley catheter in place.  Absent rectal tone.  Deep tendon reflexes with hypoactive patellar reflexes.  Achilles recently flexes are absent bilaterally.  Examination head ears eyes nose throat is unremarkable her chest and abdomen are benign.  Extremities are free from injury or deformity. 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  I discussed situation with the patient.  Recommended emergent surgery.  I recommended a right-sided L4-5 decompressive laminotomy with right-sided L4-5 microdiscectomy.  I discussed the risks involved with surgery including but not limited to the risk of anesthesia, bleeding, infection, CSF leak, nerve root injury, discrimination, later instability, continued pain, and non-benefit.  Patient has been given the opportunity ask questions.  She appears to understand.  She wishes to proceed with surgery.  Kathaleen Maser Leonette Tischer 01/17/2019, 9:34 PM

## 2019-01-17 NOTE — Op Note (Signed)
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  Asst. Surgeon: Doran Durand, NP  Anesthesia: General  Indication: 43 year old female presents with severe back pain with bilateral pain and numbness and weakness extending to both lower extremities.  Patient with loss of sacral sensation and bowel and bladder continence.  Work-up demonstrates evidence of enlarged paracentral disc herniation at L4-5 with critical spinal stenosis.  Patient presents now for microdiscectomy in hopes of improving her symptoms.  Operative note: After induction anesthesia, patient position prone on the Wilson frame and appropriate padded.  Lumbar region prepped and draped sterilely.  Incision made overlying L4-5.  Dissection performed on the right.  Retractor placed.  X-ray taken.  And level was found to be at the L5-S1 level.  Retractor redirected one level cephalad.  Laminotomy then performed using high-speed drill and Kerrison Rogers to remove the inferior aspect lamina of L4 the medial aspect the L4-5 facet joint and the superior aspect of the L5 lamina.  Ligament flavum elevated and resected.  Underlying thecal sac and right L5 nerve root were identified.  Microscope then brought to field use throughout the remainder of the discectomy.  Epidural venous plexus was coagulated and cut.  Thecal sac and L5 nerve root gently mobilized and retracted towards the midline.  The space was incised.  Disc was then entered and multiple pieces of disc were removed from the interspace itself.  At this point a very large disc herniation superior to the disc space became apparent.  This is dissected free and removed in several large pieces.  This completely decompressed the thecal sac.  The interspace was  once again cleaned of all loose or obviously degenerative disc material.  At this point a very thorough discectomy had been achieved.  There was no evidence of injury to the thecal sac or nerve roots.  Wound is then irrigated with antibiotic solution.  Gelfoam was placed topically for hemostasis.  Wound is then closed in layers with Vicryl sutures.  Steri-Strips and sterile dressing were applied.  No apparent complications.  Patient tolerated the procedure well and she returns to the recovery room postop.

## 2019-01-17 NOTE — Transfer of Care (Signed)
Immediate Anesthesia Transfer of Care Note  Patient: Danielle Moreno  Procedure(s) Performed: RIGHT LUMBAR FOUR-FIVE MICRODISCECTOMY (Right Spine Lumbar)  Patient Location: PACU  Anesthesia Type:General  Level of Consciousness: awake, alert  and oriented  Airway & Oxygen Therapy: Patient Spontanous Breathing and Patient connected to nasal cannula oxygen  Post-op Assessment: Report given to RN and Post -op Vital signs reviewed and stable  Post vital signs: Reviewed and stable  Last Vitals:  Vitals Value Taken Time  BP 160/96 01/17/2019 11:26 PM  Temp    Pulse 88 01/17/2019 11:27 PM  Resp 17 01/17/2019 11:28 PM  SpO2 100 % 01/17/2019 11:27 PM  Vitals shown include unvalidated device data.  Last Pain:  Vitals:   01/17/19 1955  TempSrc:   PainSc: 2       Patients Stated Pain Goal: 5 (01/17/19 1748)  Complications: No apparent anesthesia complications

## 2019-01-17 NOTE — ED Triage Notes (Signed)
The patient presents from home with complaints of back pain that radiates into bilateral legs. She was discharged yesterday with a diagnosis of spinal stenosis, a prescription for pain medication and a referral to a neurologist. She was unable to fill her prescription and now complains of worse pain and weakness. She reported to EMS dilaudid helped her pain and is now here for a consult and pain control.   EMS vitals and CBG 170/94 BP 88 HR 14 Resp rate 100% O2 sats on room air 119 CBG (Hx of diabetes)

## 2019-01-17 NOTE — ED Notes (Signed)
Attempted to call report. Receiving RN will call back when available. 

## 2019-01-17 NOTE — Anesthesia Preprocedure Evaluation (Addendum)
Anesthesia Evaluation  Patient identified by MRN, date of birth, ID band Patient awake    Reviewed: Allergy & Precautions, NPO status , Patient's Chart, lab work & pertinent test results  Airway Mallampati: I  TM Distance: >3 FB Neck ROM: Full    Dental  (+) Upper Dentures, Lower Dentures   Pulmonary Current Smoker,    breath sounds clear to auscultation       Cardiovascular negative cardio ROS   Rhythm:Regular Rate:Normal     Neuro/Psych    GI/Hepatic negative GI ROS, Neg liver ROS,   Endo/Other  diabetes, Type 2, Oral Hypoglycemic Agents  Renal/GU negative Renal ROS     Musculoskeletal  (+) Arthritis ,   Abdominal (+) + obese,   Peds  Hematology   Anesthesia Other Findings   Reproductive/Obstetrics                            Anesthesia Physical Anesthesia Plan  ASA: II and emergent  Anesthesia Plan: General   Post-op Pain Management:    Induction: Intravenous  PONV Risk Score and Plan: 3 and Dexamethasone, Ondansetron and Midazolam  Airway Management Planned: Oral ETT  Additional Equipment: None  Intra-op Plan:   Post-operative Plan: Extubation in OR  Informed Consent: I have reviewed the patients History and Physical, chart, labs and discussed the procedure including the risks, benefits and alternatives for the proposed anesthesia with the patient or authorized representative who has indicated his/her understanding and acceptance.       Plan Discussed with: CRNA  Anesthesia Plan Comments:        Anesthesia Quick Evaluation

## 2019-01-18 ENCOUNTER — Encounter (HOSPITAL_COMMUNITY): Payer: Self-pay | Admitting: Neurosurgery

## 2019-01-18 DIAGNOSIS — G834 Cauda equina syndrome: Secondary | ICD-10-CM

## 2019-01-18 LAB — GLUCOSE, CAPILLARY
GLUCOSE-CAPILLARY: 127 mg/dL — AB (ref 70–99)
Glucose-Capillary: 144 mg/dL — ABNORMAL HIGH (ref 70–99)
Glucose-Capillary: 97 mg/dL (ref 70–99)

## 2019-01-18 MED ORDER — MENTHOL 3 MG MT LOZG: 1.0000 | LOZENGE | OROMUCOSAL | Status: DC | PRN

## 2019-01-18 MED ORDER — INSULIN ASPART 100 UNIT/ML ~~LOC~~ SOLN
0.0000 [IU] | Freq: Three times a day (TID) | SUBCUTANEOUS | Status: DC
  Administered 2019-01-18 – 2019-01-20 (×2): 3 [IU] via SUBCUTANEOUS

## 2019-01-18 MED ORDER — PHENOL 1.4 % MT LIQD: 1.0000 | OROMUCOSAL | Status: DC | PRN

## 2019-01-18 MED ORDER — SODIUM CHLORIDE 0.9% FLUSH
3.0000 mL | INTRAVENOUS | Status: DC | PRN
  Administered 2019-01-19: 3 mL via INTRAVENOUS
  Filled 2019-01-18: qty 3

## 2019-01-18 MED ORDER — SODIUM CHLORIDE 0.9 % IV SOLN
250.0000 mL | INTRAVENOUS | Status: DC
  Administered 2019-01-18: 250 mL via INTRAVENOUS

## 2019-01-18 MED ORDER — ONDANSETRON HCL 4 MG/2ML IJ SOLN: 4.0000 mg | Freq: Four times a day (QID) | INTRAMUSCULAR | Status: DC | PRN

## 2019-01-18 MED ORDER — KETOROLAC TROMETHAMINE 15 MG/ML IJ SOLN
30.0000 mg | Freq: Four times a day (QID) | INTRAMUSCULAR | Status: AC
  Administered 2019-01-18 – 2019-01-19 (×4): 30 mg via INTRAVENOUS
  Filled 2019-01-18 (×4): qty 2

## 2019-01-18 MED ORDER — HYDROMORPHONE HCL 1 MG/ML IJ SOLN
1.0000 mg | INTRAMUSCULAR | Status: DC | PRN
  Administered 2019-01-18 – 2019-01-19 (×5): 1 mg via INTRAVENOUS
  Filled 2019-01-18 (×5): qty 1

## 2019-01-18 MED ORDER — ROSUVASTATIN CALCIUM 5 MG PO TABS
10.0000 mg | ORAL_TABLET | Freq: Every day | ORAL | Status: DC
  Administered 2019-01-18 – 2019-01-21 (×4): 10 mg via ORAL
  Filled 2019-01-18 (×4): qty 2

## 2019-01-18 MED ORDER — HYDROCODONE-ACETAMINOPHEN 5-325 MG PO TABS: 1.0000 | ORAL_TABLET | ORAL | Status: DC | PRN

## 2019-01-18 MED ORDER — ONDANSETRON HCL 4 MG PO TABS: 4.0000 mg | ORAL_TABLET | Freq: Four times a day (QID) | ORAL | Status: DC | PRN

## 2019-01-18 MED ORDER — ACETAMINOPHEN 650 MG RE SUPP: 650.0000 mg | RECTAL | Status: DC | PRN

## 2019-01-18 MED ORDER — METFORMIN HCL 500 MG PO TABS
500.0000 mg | ORAL_TABLET | Freq: Two times a day (BID) | ORAL | Status: DC
  Administered 2019-01-18 – 2019-01-21 (×7): 500 mg via ORAL
  Filled 2019-01-18 (×7): qty 1

## 2019-01-18 MED ORDER — ACETAMINOPHEN 325 MG PO TABS
650.0000 mg | ORAL_TABLET | ORAL | Status: DC | PRN
  Administered 2019-01-20: 650 mg via ORAL
  Filled 2019-01-18: qty 2

## 2019-01-18 MED ORDER — SODIUM CHLORIDE 0.9% FLUSH
3.0000 mL | Freq: Two times a day (BID) | INTRAVENOUS | Status: DC
  Administered 2019-01-18 – 2019-01-20 (×6): 3 mL via INTRAVENOUS

## 2019-01-18 MED ORDER — HYDROCODONE-ACETAMINOPHEN 10-325 MG PO TABS
2.0000 | ORAL_TABLET | ORAL | Status: DC | PRN
  Administered 2019-01-18 – 2019-01-20 (×3): 2 via ORAL
  Filled 2019-01-18 (×4): qty 2

## 2019-01-18 NOTE — Progress Notes (Signed)
Physical medicine rehabilitation consult requested chart reviewed. Patient just from the operating room 01/17/2019 11:16 PM for right lumbar 4-5 microdiscectomy HNP. Plan to await physical and occupational therapy evaluations and follow-up with appropriate recommendations

## 2019-01-18 NOTE — Evaluation (Signed)
Occupational Therapy Evaluation Patient Details Name: Danielle Moreno MRN: [id number] DOB: 15-Nov-1976 Today's Date: 01/18/2019    History of Present Illness Pt is a 43 y/o female s/p L4-5 decompressive laminotomy with R sided L4-5 microdiscectomy after large R sided L4-5 paracentral disc herniation with severe stenosis.  PMH: DM.    Clinical Impression   PTA patient reports independent, working and driving.  Admitted for above and limited by problem list below, including pain, decreased activity tolerance, back precautions and decreased B LE coordination/strength.  She requires min assist for bed mobility, max-total assist +2 for LB ADLs and toileting, max assist +2 for basic transfers using rolling walker, and setup assist for UB ADls seated.  Patient educated on precautions, safety, mobility and ADL compensatory techniques.  She will benefit from continued OT services while admitted and believe she will best benefit from intensive CIR level rehab in order to optimize independence and return to PLOF.  Will follow.     Follow Up Recommendations  CIR    Equipment Recommendations  Other (comment)(TBd at next venue of care)    Recommendations for Other Services Rehab consult     Precautions / Restrictions Precautions Precautions: Back;Fall Precaution Booklet Issued: No Precaution Comments: reviewed back precautions with pt  Required Braces or Orthoses: (no brace needed order ) Restrictions Weight Bearing Restrictions: No      Mobility Bed Mobility Overal bed mobility: Needs Assistance Bed Mobility: Rolling;Sidelying to Sit Rolling: Min assist Sidelying to sit: Min assist       General bed mobility comments: min assist for L LE mgmt to roll towards R side and min assist to ascend trunk, cueing for log roll technique  Transfers Overall transfer level: Needs assistance Equipment used: Rolling walker (2 wheeled) Transfers: Sit to/from [employer] Sit to Stand:  Max assist;+2 physical assistance;+2 safety/equipment Stand pivot transfers: Max assist;+2 physical assistance;+2 safety/equipment       General transfer comment: max assist +2 to ascned into standing, using R hand on walker and pushing with L hand, poor awareness and coordination of B LEs to pivot into recliner     Balance Overall balance assessment: Needs assistance Sitting-balance support: Bilateral upper extremity supported;Feet supported Sitting balance-Leahy Scale: Fair     Standing balance support: Bilateral upper extremity supported;During functional activity Standing balance-Leahy Scale: Poor Standing balance comment: relaint on BUE and external support                           ADL either performed or assessed with clinical judgement   ADL Overall ADL's : Needs assistance/impaired     Grooming: Set up;Sitting   Upper Body Bathing: Set up;Sitting   Lower Body Bathing: Maximal assistance;+2 for physical assistance;+2 for safety/equipment;Sit to/from stand;Cueing for compensatory techniques;Cueing for back precautions   Upper Body Dressing : Set up;Sitting   Lower Body Dressing: Total assistance;+2 for physical assistance;+2 for safety/equipment;Sit to/from stand;Cueing for compensatory techniques;Cueing for back precautions Lower Body Dressing Details (indicate cue type and reason): limited figure 4 technique, requires assist for all parts seated and +2 in standing  Toilet Transfer: Maximal assistance;+2 for physical assistance;+2 for safety/equipment;Stand-pivot;RW Toilet Transfer Details (indicate cue type and reason): simulated to recliner  Toileting- Clothing Manipulation and Hygiene: Total assistance;+2 for physical assistance;+2 for safety/equipment;Sit to/from stand Toileting - Clothing Manipulation Details (indicate cue type and reason): relaint on B UE support in standing        General ADL Comments: pt  limited by decreased strength and sensation in  BLEs, back precautions and pain      Vision Baseline Vision/History: No visual deficits Vision Assessment?: No apparent visual deficits     Perception     Praxis      Pertinent Vitals/Pain Pain Assessment: 0-10 Pain Score: 7  Pain Location: back and into BLEs Pain Descriptors / Indicators: Sore Pain Intervention(s): Monitored during session;Repositioned     Hand Dominance     Extremity/Trunk Assessment Upper Extremity Assessment Upper Extremity Assessment: Overall WFL for tasks assessed   Lower Extremity Assessment Lower Extremity Assessment: Defer to PT evaluation   Cervical / Trunk Assessment Cervical / Trunk Assessment: Other exceptions Cervical / Trunk Exceptions: s/p lumbar sx   Communication Communication Communication: No difficulties   Cognition Arousal/Alertness: Awake/alert Behavior During Therapy: WFL for tasks assessed/performed Overall Cognitive Status: Within Functional Limits for tasks assessed                                     General Comments  reviewed precautions, ADl compensatory techniques, and mobility     Exercises     Shoulder Instructions      Home Living Family/patient expects to be discharged to:: Private residence Living Arrangements: Spouse/significant other;Children Available Help at Discharge: Family;Available PRN/intermittently Type of Home: House Home Access: Stairs to enter [employer] of Steps: 4 Entrance Stairs-Rails: Right Home Layout: One level     Bathroom Shower/Tub: Chief Strategy Officer: Standard     Home Equipment: None          Prior Functioning/Environment Level of Independence: Independent        Comments: Works in Development worker, 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 Drives.        OT Problem List: Decreased strength;Decreased activity tolerance;Impaired balance (sitting and/or standing);Decreased coordination;Decreased knowledge of use of DME or AE;Decreased knowledge  of precautions;Impaired sensation;Pain      OT Treatment/Interventions: Self-care/ADL training;Therapeutic exercise;DME and/or AE instruction;Energy conservation;Therapeutic activities;Patient/family education;Balance training    OT Goals(Current goals can be found in the care plan section) Acute Rehab OT Goals Patient Stated Goal: to get back to my normal OT Goal Formulation: With patient Time For Goal Achievement: 02/01/19 Potential to Achieve Goals: Good  OT Frequency: Min 3X/week   Barriers to D/C:            Co-evaluation PT/OT/SLP Co-Evaluation/Treatment: Yes Reason for Co-Treatment: For patient/therapist safety;To address functional/ADL transfers   OT goals addressed during session: ADL's and self-care;Other (comment)(mobility)      AM-PAC OT "6 Clicks" Daily Activity     Outcome Measure Help from another person eating meals?: None Help from another person taking care of personal grooming?: None(seated) Help from another person toileting, which includes using toliet, bedpan, or urinal?: Total Help from another person bathing (including washing, rinsing, drying)?: A Lot Help from another person to put on and taking off regular upper body clothing?: None Help from another person to put on and taking off regular lower body clothing?: Total 6 Click Score: 16   End of Session Equipment Utilized During Treatment: Gait belt;Rolling walker Nurse Communication: Mobility status  Activity Tolerance: Patient tolerated treatment well Patient left: in chair;with call bell/phone within reach  OT Visit Diagnosis: Other abnormalities of gait and mobility (R26.89);Muscle weakness (generalized) (M62.81);Pain Pain - part of body: (back, LEs)  Time: 0488-8916 OT Time Calculation (min): 27 min Charges:  OT General Charges $OT Visit: 1 Visit OT Evaluation $OT Eval Moderate Complexity: 1 Mod  Chancy Milroy, [place] [facility] Pager  [phone] Office [phone]   Chancy Milroy 01/18/2019, 9:47 AM

## 2019-01-18 NOTE — Progress Notes (Addendum)
Inpatient Rehabilitation Admissions Coordinator  I met with patient, spouse, 45 year old daughter, and Mom at bedside. We dicussed goals and expectations of an inpt rehab admit. They are in agreement to admit. I will begin insurance authorization with [employer] for a possible admit tomorrow pending insurance approval and Dr. Annette Stable clearance to d/c to CIR. I will follow up tomorrow.  Danne Baxter, RN, MSN Rehab Admissions Coordinator [phone] 01/18/2019 4:10 PM   Addendum: I checked benefits with [employer] local [employer]. Her policy is connected with [facility] and is out of network for [employer]. Substantial difference in cost to patient. I met with patient, spouse, daughter and Mother at bedside and they are aware. They wish to pursue inpt rehab at [facility] and [facility] where she would have in network benefits. I discussed the need to pursue in network coverage for her emergency admission and surgery. I have contacted RN CM, Kristie, by voicemail and will follow up in the morning.  Danne Baxter, RN, MSN Rehab Admissions Coordinator [phone] 01/18/2019 5:15 PM

## 2019-01-18 NOTE — Consult Note (Signed)
Physical Medicine and Rehabilitation Consult Reason for Consult: Numbness and weakness bilateral lower extremities Referring Physician: Dr. Jordan Likes   HPI: Danielle Moreno is a 43 y.o.right handed female with history of diabetes mellitus, tobacco abuse. Per chart review patient lives with spouse. Independent prior to admission. One level home with 4 steps to entry. She works in [employer] in the [employer].Patient presented 01/17/2019 bilateral distal lower extremity weakness. Patient with severe back pain 4-5 days. No history of significant precipitating event. Patient reports over 24 hour period she develop increasing sensory loss and weakness in both distal  lower extremities. She also lost feeling in her perineal region and was incontinent of urine. 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 Patient underwent emergent right L4-5 laminotomy and microdiscectomy 01/17/2019 per Dr. Jordan Likes. Hospital course pain management. No back brace needed. Therapy evaluations completed with recommendations of physical medicine rehabilitation consult.   Review of Systems  Constitutional: Negative for chills and fever.  HENT: Negative for hearing loss.   Eyes: Negative for blurred vision and double vision.  Respiratory: Negative for cough and shortness of breath.   Cardiovascular: Negative for chest pain, palpitations and leg swelling.  Gastrointestinal: Positive for constipation. Negative for nausea and vomiting.  Genitourinary: Negative for dysuria and hematuria.       Bowel and bladder incontinence  Skin: Negative for rash.  Neurological: Positive for sensory change and weakness.  All other systems reviewed and are negative.  Past Medical History:  Diagnosis Date  . Diabetes mellitus without complication [facility])    Past Surgical History:  Procedure Laterality Date  . NO PAST SURGERIES     Family  History  Problem Relation Age of Onset  . Diabetes Mother   . Healthy Father    Social History:  reports that she has been smoking cigarettes. She has never used smokeless tobacco. She reports that she does not drink alcohol or use drugs. Allergies: No Known Allergies Medications Prior to Admission  Medication Sig Dispense Refill  . cyclobenzaprine (FLEXERIL) 5 MG tablet Take 1-2 tablets (5-10 mg total) by mouth 2 (two) times daily as needed for muscle spasms. 24 tablet 0  . ibuprofen (ADVIL,MOTRIN) 600 MG tablet Take 1 tablet (600 mg total) by mouth every 6 (six) hours as needed. (Patient taking differently: Take 600 mg by mouth every 6 (six) hours as needed for headache, mild pain or moderate pain. ) 30 tablet 0  . metFORMIN (GLUCOPHAGE) 500 MG tablet Take 1 tablet (500 mg total) by mouth 2 (two) times daily with a meal. 60 tablet 2  . methocarbamol (ROBAXIN) 500 MG tablet Take 1 tablet (500 mg total) by mouth 2 (two) times daily. (Patient taking differently: Take 500 mg by mouth 2 (two) times daily as needed for muscle spasms. ) 20 tablet 0  . naproxen (NAPROSYN) 500 MG tablet Take 1 tablet (500 mg total) by mouth 2 (two) times daily. 30 tablet 0  . predniSONE (DELTASONE) 50 MG tablet Take 1 tablet (50 mg total) by mouth daily for 5 days. Start on 2/28 5 tablet 0  . diclofenac sodium (VOLTAREN) 1 % GEL Apply 2 g topically 4 (four) times daily. (Patient not taking: Reported on 01/17/2019) 100 g 3  . lidocaine (LIDODERM) 5 % Place 1 patch onto the skin daily. Remove & Discard patch within 12 hours or as directed by MD (Patient not taking: Reported on 01/17/2019) 30  patch 0  . methylPREDNISolone (MEDROL DOSEPAK) 4 MG TBPK tablet As directed (Patient not taking: Reported on 01/17/2019) 21 tablet 0  . oxyCODONE-acetaminophen (PERCOCET) 5-325 MG tablet Take 1-2 tablets by mouth every 6 (six) hours as needed for severe pain. (Patient not taking: Reported on 01/17/2019) 15 tablet 0  . rosuvastatin (CRESTOR) 10  MG tablet Take 1 tablet (10 mg total) by mouth daily. To lower cholesterol (Patient not taking: Reported on 01/17/2019) 30 tablet 6    Home: Home Living Family/patient expects to be discharged to:: Private residence Living Arrangements: Spouse/significant other, Children Available Help at Discharge: Family, Available PRN/intermittently Type of Home: House Home Access: Stairs to enter [employer] of Steps: 4 Entrance Stairs-Rails: Right Home Layout: One level Bathroom Shower/Tub: Engineer, manufacturing systems: Standard Home Equipment: None  Functional History: Prior Function Level of Independence: Independent Comments: Works in Development worker, 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 Functional Status:  Mobility: Bed Mobility Overal bed mobility: Needs Assistance Bed Mobility: Rolling, Sidelying to Sit Rolling: Min assist Sidelying to sit: Min assist, HOB elevated General bed mobility comments: min assist for L LE mgmt to roll towards R side and min assist to ascend trunk, cueing for log roll technique Transfers Overall transfer level: Needs assistance Equipment used: Rolling walker (2 wheeled) Transfers: Sit to/from Stand, Stand Pivot Transfers Sit to Stand: Max assist, +2 physical assistance, +2 safety/equipment Stand pivot transfers: Max assist, +2 physical assistance, +2 safety/equipment General transfer comment: max assist +2 to asceed into standing with cues for hand placement/technique; using Rt hand on walker and pushing with Lft hand, poor awareness and coordination of B LEs to pivot into recliner. Not able to progress LEs and bil knees buckling. Stood from [employer], from chair x1.  Ambulation/Gait General Gait Details: Unable    ADL: ADL Overall ADL's : Needs assistance/impaired Grooming: Set up, Sitting Upper Body Bathing: Set up, Sitting Lower Body Bathing: Maximal assistance, +2 for physical assistance, +2 for safety/equipment, Sit to/from stand, Cueing for  compensatory techniques, Cueing for back precautions Upper Body Dressing : Set up, Sitting Lower Body Dressing: Total assistance, +2 for physical assistance, +2 for safety/equipment, Sit to/from stand, Cueing for compensatory techniques, Cueing for back precautions Lower Body Dressing Details (indicate cue type and reason): limited figure 4 technique, requires assist for all parts seated and +2 in standing  Toilet Transfer: Maximal assistance, +2 for physical assistance, +2 for safety/equipment, Stand-pivot, RW Toilet Transfer Details (indicate cue type and reason): simulated to recliner  Toileting- Clothing Manipulation and Hygiene: Total assistance, +2 for physical assistance, +2 for safety/equipment, Sit to/from stand Toileting - Clothing Manipulation Details (indicate cue type and reason): relaint on B UE support in standing  General ADL Comments: pt limited by decreased strength and sensation in BLEs, back precautions and pain   Cognition: Cognition Overall Cognitive Status: Within Functional Limits for tasks assessed Orientation Level: Oriented X4 Cognition Arousal/Alertness: Awake/alert Behavior During Therapy: WFL for tasks assessed/performed Overall Cognitive Status: Within Functional Limits for tasks assessed  Blood pressure (!) 161/90, pulse 75, temperature 97.8 F (36.6 C), temperature source Oral, resp. rate 18, height 5\' 8"  (1.727 m), weight 112.9 kg, last menstrual period 01/16/2019, SpO2 97 %. Physical Exam  Constitutional: She is oriented to person, place, and time. She appears well-developed.  HENT:  Head: Normocephalic.  Eyes: Pupils are equal, round, and reactive to light.  Neck: Normal range of motion.  Cardiovascular: Normal rate.  Respiratory: Effort normal.  GI: Soft.  Musculoskeletal: Normal range  of motion.        General: Tenderness present.  Neurological: She is alert and oriented to person, place, and time.  UE 5/5. RLE: 2+ to 3/5 HF, KE. 2/5 KF, 1/5ADF,  tr APF, LLE: 1-2/5 HF, KE and trace ADF, 0/5 APF. HAD 3/5, HAB 1-2/5. Decreased sensation in both feet and saddle area. DTR's 1+ knees and absent achilles  Skin: Skin is warm.  Psychiatric: She has a normal mood and affect.    Results for orders placed or performed during the hospital encounter of 01/17/19 (from the past 24 hour(s))  Comprehensive metabolic panel     Status: Abnormal   Collection Time: 01/17/19  5:41 PM  Result Value Ref Range   Sodium 138 135 - 145 mmol/L   Potassium 4.5 3.5 - 5.1 mmol/L   Chloride 107 98 - 111 mmol/L   CO2 22 22 - 32 mmol/L   Glucose, Bld 142 (H) 70 - 99 mg/dL   BUN 31 (H) 6 - 20 mg/dL   Creatinine, Ser 0.10 (H) 0.44 - 1.00 mg/dL   Calcium 9.5 8.9 - 27.2 mg/dL   Total Protein 7.7 6.5 - 8.1 g/dL   Albumin 4.1 3.5 - 5.0 g/dL   AST 15 15 - 41 U/L   ALT 14 0 - 44 U/L   Alkaline Phosphatase 65 38 - 126 U/L   Total Bilirubin 0.6 0.3 - 1.2 mg/dL   GFR calc non Af Amer 46 (L) >60 mL/min   GFR calc Af Amer 54 (L) >60 mL/min   Anion gap 9 5 - 15  CBC with Differential     Status: Abnormal   Collection Time: 01/17/19  5:41 PM  Result Value Ref Range   WBC 12.3 (H) 4.0 - 10.5 K/uL   RBC 4.83 3.87 - 5.11 MIL/uL   Hemoglobin 13.9 12.0 - 15.0 g/dL   HCT 53.6 64.4 - 03.4 %   MCV 91.5 80.0 - 100.0 fL   MCH 28.8 26.0 - 34.0 pg   MCHC 31.4 30.0 - 36.0 g/dL   RDW 74.2 59.5 - 63.8 %   Platelets 279 150 - 400 K/uL   nRBC 0.0 0.0 - 0.2 %   Neutrophils Relative % 83 %   Neutro Abs 10.2 (H) 1.7 - 7.7 K/uL   Lymphocytes Relative 12 %   Lymphs Abs 1.5 0.7 - 4.0 K/uL   Monocytes Relative 4 %   Monocytes Absolute 0.5 0.1 - 1.0 K/uL   Eosinophils Relative 0 %   Eosinophils Absolute 0.0 0.0 - 0.5 K/uL   Basophils Relative 0 %   Basophils Absolute 0.0 0.0 - 0.1 K/uL   Immature Granulocytes 1 %   Abs Immature Granulocytes 0.08 (H) 0.00 - 0.07 K/uL  Glucose, capillary     Status: Abnormal   Collection Time: 01/17/19 11:35 PM  Result Value Ref Range    Glucose-Capillary 122 (H) 70 - 99 mg/dL   Mr Lumbar Spine Wo Contrast  Result Date: 01/16/2019 CLINICAL DATA:  Initial evaluation for progressive left-sided low back pain for 1 week, extending into the left lower extremity. EXAM: MRI LUMBAR SPINE WITHOUT CONTRAST TECHNIQUE: Multiplanar, multisequence MR imaging of the lumbar spine was performed. No intravenous contrast was administered. COMPARISON:  Previous radiographs from 01/14/2019. FINDINGS: Segmentation: Standard. Lowest well-formed disc labeled the L5-S1 level. Alignment: Trace dextroscoliosis with straightening of the normal lumbar lordosis. Trace retrolisthesis of L3 on L4. Vertebrae: Vertebral body heights maintained without evidence for acute or chronic fracture. Bone marrow  signal intensity within normal limits. No discrete or worrisome osseous lesions. Reactive endplate changes present about the L4-5 and L5-S1 interspaces. No other abnormal marrow edema. Conus medullaris and cauda equina: Conus extends to the L1 level. Conus and cauda equina appear normal. Paraspinal and other soft tissues: Paraspinous soft tissues within normal limits. Visualized visceral structures are unremarkable. Disc levels: T11-12: Seen only on sagittal projection. Mild disc bulge with disc desiccation. Bilateral facet hypertrophy. No significant spinal stenosis. Mild right foraminal narrowing. T12-L1: Unremarkable. L1-2: Negative interspace. Bilateral facet hypertrophy with osteophytic spurring about the left L1-2 facet. Resultant mild left lateral recess and foraminal stenosis. Central canal remains patent. L2-3:  Unremarkable. L3-4: Diffuse disc bulge with disc desiccation and intervertebral disc space narrowing. Superimposed shallow central disc protrusion mildly indents the ventral thecal sac. Associated annular fissure. Mild facet and ligament flavum hypertrophy. Resultant mild canal with bilateral lateral recess stenosis, left slightly worse than right. Foramina remain  patent. L4-5: Diffuse disc bulge with disc desiccation. Associated reactive discogenic endplate changes. Superimposed moderate-sized central disc protrusion indents the ventral thecal sac. Resultant severe canal and bilateral lateral recess stenosis. Foramina remain patent. L5-S1: Disc desiccation with broad-based central disc protrusion, mildly indenting the ventral thecal sac. Protruding disc contacts the descending S1 nerve roots in the lateral recesses without frank impingement. Mild bilateral lateral recess stenosis. Central canal remains patent. Mild bilateral foraminal stenosis. IMPRESSION: 1. Moderate sized central disc protrusion at L4-5 with resultant severe spinal stenosis, likely accounting for patient's symptoms. 2. Shallow central disc protrusion at L5-S1, closely approximating the descending S1 nerve roots without frank impingement. 3. Disc bulging with facet hypertrophy at L3-4 with resultant mild canal with left greater than right lateral recess stenosis. Electronically Signed   By: Rise Mu M.D.   On: 01/16/2019 22:19   Dg Lumbar Spine 1 View  Result Date: 01/17/2019 CLINICAL DATA:  L4-5 discectomy. EXAM: LUMBAR SPINE - 1 VIEW COMPARISON:  None. FINDINGS: Localizing markers project over posterior elements at L5-S1. There is disc space narrowing from L3 through S1. No acute osseous appearing. IMPRESSION: Localizing markers project over the posterior elements at L5-S1. The ordering clinician is aware per study notes. Electronically Signed   By: Tollie Eth M.D.   On: 01/17/2019 22:44     Assessment/Plan: Diagnosis: cauda equina syndrome secondary to disc herniation and stenosis, s/p decompressive microdiscectomy and L4-5 laminotomy 1. Does the need for close, 24 hr/day medical supervision in concert with the patient's rehab needs make it unreasonable for this patient to be served in a less intensive setting? Yes 2. Co-Morbidities requiring supervision/potential complications:  DM, neurogenic bowel and bladder 3. Due to bladder management, bowel management, safety, skin/wound care, disease management, medication administration, pain management and patient education, does the patient require 24 hr/day rehab nursing? Yes 4. Does the patient require coordinated care of a physician, rehab nurse, PT (1-2 hrs/day, 5 days/week) and OT (1-2 hrs/day, 5 days/week) to address physical and functional deficits in the context of the above medical diagnosis(es)? Yes Addressing deficits in the following areas: balance, endurance, locomotion, strength, transferring, bowel/bladder control, bathing, dressing, feeding, grooming, toileting and psychosocial support 5. Can the patient actively participate in an intensive therapy program of at least 3 hrs of therapy per day at least 5 days per week? Yes 6. The potential for patient to make measurable gains while on inpatient rehab is excellent 7. Anticipated functional outcomes upon discharge from inpatient rehab are modified independent and supervision  with PT, modified independent  and supervision with OT, n/a with SLP. 8. Estimated rehab length of stay to reach the above functional goals is: 14-18 days 9. Anticipated D/C setting: Home 10. Anticipated post D/C treatments: HH therapy and Outpatient therapy 11. Overall Rehab/Functional Prognosis: excellent  RECOMMENDATIONS: This patient's condition is appropriate for continued rehabilitative care in the following setting: CIR Patient has agreed to participate in recommended program. Yes Note that insurance prior authorization may be required for reimbursement for recommended care.  Comment: Rehab Admissions Coordinator to follow up.  Thanks,  Ranelle Oyster, MD, [place]  I have personally performed a face to face diagnostic evaluation of this patient. Additionally, I have examined pertinent labs and radiographic images. I have reviewed and concur with the physician assistant's  documentation above.    Mcarthur Rossetti Angiulli, PA-C 01/18/2019

## 2019-01-18 NOTE — Progress Notes (Signed)
Postop day 1.  Patient denies back or radiating pain.  Numbness in both feet improved but still has numbness in her perineal region.  Still with no voluntary dorsiflexion or plantarflexion.  Afebrile.  Vital signs are stable.  Awake and alert.  Oriented and appropriate.  Motor and sensory function of her upper extremities normal.  Motor extremity of her lower extremities with 5/5 strength of her quadriceps and hip flexors bilaterally.  0/5 strength of her anterior tibialis and extensor houses longus muscles bilaterally.  Flexor contraction of her gastrocnemius muscles bilaterally.  L5 sensation improved bilaterally.  Patient with some restoration of her S1 sensation on the right.  Still with dense sensory loss in her left S1 dermatome.  Still with no perineal sensation.  Patient status post very large paracentral disc herniation with cauda equina injury at L4-5.  Surgery uncomplicated and has relieved her back and radicular pain.  Unfortunately she still has significant neurologic deficits and will require significant rehabilitation prior to being able to be discharged home.

## 2019-01-18 NOTE — Evaluation (Signed)
Physical Therapy Evaluation Patient Details Name: Danielle Moreno MRN: [id number] DOB: [birth year] Today's Date: 01/18/2019   History of Present Illness  Pt is a 43 y/o female s/p L4-5 decompressive laminotomy with R sided L4-5 microdiscectomy after large R sided L4-5 paracentral disc herniation with severe stenosis.  PMH: DM.   Clinical Impression  Patient presents with decreased sensation, decreased strength BLEs, decreased proprioception, pain and impaired mobility s/p above. 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 Today, pt tolerated bed mobility with Min A and standing with Max A of 2 due to weakness and sensation deficits BLEs. Education re: log roll technique, back precautions, positioning, exercise recommendations etc. Tolerated SPT to chair with Max A of 2. Would benefit from CIR to maximize independence and mobility prior to return home. Will follow acutely.    Follow Up Recommendations CIR;Supervision for mobility/OOB;Supervision/Assistance - 24 hour    Equipment Recommendations  Rolling walker with 5" wheels    Recommendations for Other Services       Precautions / Restrictions Precautions Precautions: Back;Fall Precaution Booklet Issued: No Precaution Comments: reviewed back precautions with pt  Required Braces or Orthoses: (no brace needed order ) Restrictions Weight Bearing Restrictions: No      Mobility  Bed Mobility Overal bed mobility: Needs Assistance Bed Mobility: Rolling;Sidelying to Sit Rolling: Min assist Sidelying to sit: Min assist;HOB elevated       General bed mobility comments: min assist for L LE mgmt to roll towards R side and min assist to ascend trunk, cueing for log roll technique  Transfers Overall transfer level: Needs assistance Equipment used: Rolling walker (2 wheeled) Transfers: Sit to/from [employer] Sit to Stand: Max assist;+2 physical assistance;+2  safety/equipment Stand pivot transfers: Max assist;+2 physical assistance;+2 safety/equipment       General transfer comment: max assist +2 to asceed into standing with cues for hand placement/technique; using Rt hand on walker and pushing with Lft hand, poor awareness and coordination of B LEs to pivot into recliner. Not able to progress LEs and bil knees buckling. Stood from [employer], from chair x1.   Ambulation/Gait             General Gait Details: Unable  Stairs            Wheelchair Mobility    Modified Rankin (Stroke Patients Only)       Balance Overall balance assessment: Needs assistance Sitting-balance support: Bilateral upper extremity supported;Feet supported Sitting balance-Leahy Scale: Fair     Standing balance support: During functional activity;Bilateral upper extremity supported Standing balance-Leahy Scale: Poor Standing balance comment: reliant on BUE and external support                             Pertinent Vitals/Pain Pain Assessment: 0-10 Pain Score: 7  Pain Location: back and into BLEs Pain Descriptors / Indicators: Sore;Operative site guarding Pain Intervention(s): Monitored during session;Repositioned;Premedicated before session    Home Living Family/patient expects to be discharged to:: Private residence Living Arrangements: Spouse/significant other;Children Available Help at Discharge: Family;Available PRN/intermittently Type of Home: House Home Access: Stairs to enter Entrance Stairs-Rails: Right Entrance Stairs-Number of Steps: 4 Home Layout: One level Home Equipment: None      Prior Function Level of Independence: Independent         Comments: Works in Development worker, 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     Hand Dominance  Extremity/Trunk Assessment   Upper Extremity Assessment Upper Extremity Assessment: Defer to OT evaluation    Lower Extremity Assessment Lower Extremity Assessment: LLE  deficits/detail;RLE deficits/detail RLE Deficits / Details: Grossly ~1/5 DF, 0/5 PF, 3/5 knee extension, 2+/5 hip flexion RLE Sensation: decreased light touch;decreased proprioception RLE Coordination: decreased fine motor;decreased gross motor LLE Deficits / Details: Grossly ~1/5 DF, 0/5 PF, 3/5 knee extension, 2+/5 hip flexion LLE Sensation: decreased light touch;decreased proprioception LLE Coordination: decreased fine motor;decreased gross motor    Cervical / Trunk Assessment Cervical / Trunk Assessment: Other exceptions Cervical / Trunk Exceptions: s/p lumbar sx  Communication   Communication: No difficulties  Cognition Arousal/Alertness: Awake/alert Behavior During Therapy: WFL for tasks assessed/performed Overall Cognitive Status: Within Functional Limits for tasks assessed                                        General Comments General comments (skin integrity, edema, etc.): reviewed precautions, ADl compensatory techniques, and mobility     Exercises     Assessment/Plan    PT Assessment Patient needs continued PT services  PT Problem List Decreased strength;Decreased balance;Decreased knowledge of precautions;Pain;Decreased knowledge of use of DME;Decreased mobility;Decreased range of motion;Decreased coordination;Impaired sensation;Decreased skin integrity       PT Treatment Interventions Functional mobility training;Balance training;Patient/family education;Gait training;Therapeutic activities;Therapeutic exercise;Stair training;Neuromuscular re-education;DME instruction    PT Goals (Current goals can be found in the Care Plan section)  Acute Rehab PT Goals Patient Stated Goal: to get back to my normal and be able to walk PT Goal Formulation: With patient Time For Goal Achievement: 02/01/19 Potential to Achieve Goals: Good    Frequency Min 5X/week   Barriers to discharge Inaccessible home environment;Decreased caregiver support stairs     Co-evaluation PT/OT/SLP Co-Evaluation/Treatment: Yes Reason for Co-Treatment: For patient/therapist safety;To address functional/ADL transfers PT goals addressed during session: Mobility/safety with mobility;Proper use of DME;Balance;Strengthening/ROM OT goals addressed during session: ADL's and self-care;Other (comment)(mobility)       AM-PAC PT "6 Clicks" Mobility  Outcome Measure Help needed turning from your back to your side while in a flat bed without using bedrails?: A Little Help needed moving from lying on your back to sitting on the side of a flat bed without using bedrails?: A Little Help needed moving to and from a bed to a chair (including a wheelchair)?: A Lot Help needed standing up from a chair using your arms (e.g., wheelchair or bedside chair)?: A Lot Help needed to walk in hospital room?: Total Help needed climbing 3-5 steps with a railing? : Total 6 Click Score: 12    End of Session Equipment Utilized During Treatment: Gait belt Activity Tolerance: Patient tolerated treatment well Patient left: in chair;with call bell/phone within reach Nurse Communication: Mobility status PT Visit Diagnosis: Pain;Muscle weakness (generalized) (M62.81);Other abnormalities of gait and mobility (R26.89);Unsteadiness on feet (R26.81) Pain - part of body: (back)    Time: 5027-7412 PT Time Calculation (min) (ACUTE ONLY): 32 min   Charges:   PT Evaluation $PT Eval Moderate Complexity: 1 Mod          Mylo Red, PT, DPT Acute Rehabilitation Services Pager [phone] Office [phone]      Blake Divine A Lanier Ensign 01/18/2019, 11:06 AM

## 2019-01-18 NOTE — Anesthesia Postprocedure Evaluation (Signed)
Anesthesia Post Note  Patient: Corynn Gorsky [place]  Procedure(s) Performed: RIGHT LUMBAR FOUR-FIVE MICRODISCECTOMY (Right Spine Lumbar)     Patient location during evaluation: PACU Anesthesia Type: General Level of consciousness: awake and alert Pain management: pain level controlled Vital Signs Assessment: post-procedure vital signs reviewed and stable Respiratory status: spontaneous breathing, nonlabored ventilation, respiratory function stable and patient connected to nasal cannula oxygen Cardiovascular status: blood pressure returned to baseline and stable Postop Assessment: no apparent nausea or vomiting Anesthetic complications: no    Last Vitals:  Vitals:   01/18/19 1200 01/18/19 1633  BP: 131/86 (!) 155/86  Pulse: 85 75  Resp: 16 17  Temp:    SpO2: 99% 98%    Last Pain:  Vitals:   01/18/19 1600  TempSrc:   PainSc: 0-No pain                 Shelton Silvas

## 2019-01-19 LAB — GLUCOSE, CAPILLARY
Glucose-Capillary: 97 mg/dL (ref 70–99)
Glucose-Capillary: 89 mg/dL (ref 70–99)
Glucose-Capillary: 94 mg/dL (ref 70–99)
Glucose-Capillary: 117 mg/dL — ABNORMAL HIGH (ref 70–99)

## 2019-01-19 NOTE — Progress Notes (Signed)
Paged doctor and tried to call the clinic no page back and reached nobody at the clinic

## 2019-01-19 NOTE — Care Management Note (Signed)
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 CM also spoke with pt and mother at bedside to update. Will await word from centers.  1230- received calls back from both [facility] and [facility]- they both can offer beds pending insurance approval- spoke again with pt for choice of IP center- per pt she would prefer [facility] location as it is closer to home. Call made to [facility] with the [facility] to let her know pt choice. Call then made to [facility] with [facility] rehab to let her know to start process for insurance auth per pt choice. Will await to hear from [facility] IP rehab pending insurance approval for IP rehab.  Expected Discharge Date:                  Expected Discharge Plan:  IP Rehab Facility  In-House Referral:     Discharge planning Services  CM Consult  Post Acute Care Choice:  IP Rehab Choice offered to:  Patient, Spouse, Parent  DME Arranged:    DME Agency:     HH Arranged:    HH Agency:     Status of Service:  In process, will continue to follow  If discussed at Long Length of Stay Meetings, dates discussed:    Discharge Disposition:   Additional Comments:  Darrold Span, RN 01/19/2019, 11:36 AM

## 2019-01-19 NOTE — Progress Notes (Signed)
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  PMH: DM.     PT Comments    Pt continues to require heavy physical assistance of two for all mobility. Only able to pivot to chair with RW and mod A x2. Pt with great difficulty moving either foot and no active DF noted bilaterally. Pt would continue to benefit from skilled physical therapy services at this time while admitted and after d/c to address the below listed limitations in order to improve overall safety and independence with functional mobility.    Follow Up Recommendations  CIR;Supervision for mobility/OOB;Supervision/Assistance - 24 hour     Equipment Recommendations  Rolling walker with 5" wheels    Recommendations for Other Services       Precautions / Restrictions Precautions Precautions: Back;Fall Precaution Comments: reviewed back precautions with pt  Required Braces or Orthoses: (no brace needed MD order) Restrictions Weight Bearing Restrictions: No    Mobility  Bed Mobility Overal bed mobility: Needs Assistance Bed Mobility: Rolling;Sidelying to Sit Rolling: Min assist Sidelying to sit: Mod assist       General bed mobility comments: increased time and effort, cueing for log roll technique, assistance with trunk elevation and cueing to scoot forwards to EOB  Transfers Overall transfer level: Needs assistance Equipment used: Rolling walker (2 wheeled) Transfers: Sit to/from [employer] Sit to Stand: From elevated surface;+2 physical assistance;Mod assist Stand pivot transfers: +2 physical assistance;Mod assist       General transfer comment: cueing for safe hand placement, assistance needed to power into standing from  EOB, assistance and cueing needed to take pivotal steps to chair towards her R side  Ambulation/Gait             General Gait Details: Unable   Stairs             Wheelchair Mobility    Modified Rankin (Stroke Patients Only)       Balance Overall balance assessment: Needs assistance Sitting-balance support: Feet supported Sitting balance-Leahy Scale: Fair     Standing balance support: During functional activity;Bilateral upper extremity supported Standing balance-Leahy Scale: Poor Standing balance comment: reliant on BUE and external support                            Cognition Arousal/Alertness: Awake/alert Behavior During Therapy: WFL for tasks assessed/performed Overall Cognitive Status: Within Functional Limits for tasks assessed                                        Exercises      General Comments        Pertinent Vitals/Pain Pain Assessment: Faces Faces Pain Scale: Hurts even more Pain Location: back and into BLEs Pain Descriptors / Indicators: Sore;Operative site guarding Pain Intervention(s): Monitored during session;Repositioned    Home Living                      Prior Function            PT Goals (current goals can now be found in the care plan section) Acute Rehab PT Goals  PT Goal Formulation: With patient Time For Goal Achievement: 02/01/19 Potential to Achieve Goals: Good Progress towards PT goals: Progressing toward goals    Frequency    Min 5X/week      PT Plan Current plan remains appropriate    Co-evaluation              AM-PAC PT "6 Clicks" Mobility   Outcome Measure  Help needed turning from your back to your side while in a flat bed without using bedrails?: A Little Help needed moving from lying on your back to sitting on the side of a flat bed without using bedrails?: A Little Help needed moving to and from a bed to a chair (including a wheelchair)?: A Lot Help  needed standing up from a chair using your arms (e.g., wheelchair or bedside chair)?: A Lot Help needed to walk in hospital room?: Total Help needed climbing 3-5 steps with a railing? : Total 6 Click Score: 12    End of Session Equipment Utilized During Treatment: Gait belt Activity Tolerance: Patient limited by pain;Patient limited by fatigue Patient left: in chair;with call bell/phone within reach;with family/visitor present Nurse Communication: Mobility status PT Visit Diagnosis: Pain;Muscle weakness (generalized) (M62.81);Other abnormalities of gait and mobility (R26.89);Unsteadiness on feet (R26.81) Pain - part of body: (back)     Time: 8921-1941 PT Time Calculation (min) (ACUTE ONLY): 12 min  Charges:  $Therapeutic Activity: 8-22 mins                     Deborah Chalk, PT, DPT  Acute Rehabilitation Services Pager [phone] Office [phone]     Alessandra Bevels Lexus Barletta 01/19/2019, 1:18 PM

## 2019-01-19 NOTE — Progress Notes (Signed)
Stable postop.  Minimal pain.  No significant change in her sensory loss or weakness in both lower extremities.  Wound clean and dry.  Chest and abdomen benign.  Overall stable.  Continue rehab efforts.  Patient with dense cauda equina injury at the L4-5 level.  Will likely need prolonged rehab care both for bowel and bladder training but also for mobility.  Okay for transfer to rehab when bed available.

## 2019-01-19 NOTE — Progress Notes (Signed)
Inpatient Rehabilitation Admissions Coordinator  I spoke with RN CM, Silva Bandy, and she is aware of need for [facility] referral for rehab. We will sign off.  Ottie Glazier, RN, MSN Rehab Admissions Coordinator [phone] 01/19/2019 8:51 AM

## 2019-01-20 LAB — GLUCOSE, CAPILLARY
Glucose-Capillary: 103 mg/dL — ABNORMAL HIGH (ref 70–99)
Glucose-Capillary: 91 mg/dL (ref 70–99)
Glucose-Capillary: 128 mg/dL — ABNORMAL HIGH (ref 70–99)
Glucose-Capillary: 136 mg/dL — ABNORMAL HIGH (ref 70–99)

## 2019-01-20 MED ORDER — HYDROCODONE-ACETAMINOPHEN 5-325 MG PO TABS: 1.0000 | ORAL_TABLET | ORAL | 0 refills | Status: AC | PRN

## 2019-01-20 MED ORDER — CYCLOBENZAPRINE HCL 10 MG PO TABS: 10.0000 mg | ORAL_TABLET | Freq: Three times a day (TID) | ORAL | 0 refills | Status: AC | PRN

## 2019-01-20 NOTE — Discharge Summary (Signed)
Physician Discharge Summary  Patient ID: Danielle Moreno MRN: [id number] DOB/AGE: 1975-11-21 43 y.o.  Admit date: 01/17/2019 Discharge date: 01/20/2019  Admission Diagnoses:  Discharge Diagnoses:  Active Problems:   Progressive focal motor weakness   Cauda equina syndrome [facility])   Discharged Condition: good  Hospital Course: The patient was admitted to the hospital for emergent treatment of cauda equina syndrome secondary to a large L4-5 disc herniation with severe stenosis.  The patient underwent uncomplicated emergent right-sided L4-5 laminotomy and microdiskectomy.  Postoperatively her severe back and radicular pain have resolved.  Unfortunately the patient continues to have significant motor weakness involving her L5 myotomes distally.  She also has loss of sensation from L5 distally including loss of all sacral sensation.  She continues to have impaired bowel and bladder function.  This will hopefully slowly improve with time.  At the time of discharge the patient's pain is minimal.  Her wound is healing well.  She feels much improved from preop aside from her ongoing numbness and weakness.  Plan is for discharge to rehabilitation hospital for ongoing care.  Consults:   Significant Diagnostic Studies:   Treatments:   Discharge Exam: Blood pressure 129/81, pulse 84, temperature 97.7 F (36.5 C), temperature source Oral, resp. rate 18, height 5\' 8"  (1.727 m), weight 112.9 kg, last menstrual period 01/16/2019, SpO2 100 %.   Patient is awake and alert.  She is oriented and appropriate.  Speech is fluent.  Judgment insight are intact.  Cranial nerve function normal bilaterally.  Motor examination normal in both upper extremities.  Motor examination her lower extremities reveals 5/5 strength in her hip flexors knee extensors and knee flexors.  She has absent dorsiflexion bilaterally.  She has minimal plantar flexion bilaterally.  She has no toe flexion bilaterally.  Sensory examination with  dense sensory loss from L5 distally including all sacral dermatomes.  Wound is healing well.  Abdomen soft.  Chest benign.  Disposition: Discharge disposition: [facility] Care Institution Not Defined        Allergies as of 01/20/2019   No Known Allergies     Medication List    STOP taking these medications   diclofenac sodium 1 % Gel Commonly known as:  VOLTAREN   ibuprofen 600 MG tablet Commonly known as:  ADVIL,MOTRIN   lidocaine 5 % Commonly known as:  LIDODERM   methocarbamol 500 MG tablet Commonly known as:  ROBAXIN   methylPREDNISolone 4 MG Tbpk tablet Commonly known as:  MEDROL DOSEPAK   naproxen 500 MG tablet Commonly known as:  NAPROSYN   oxyCODONE-acetaminophen 5-325 MG tablet Commonly known as:  PERCOCET   predniSONE 50 MG tablet Commonly known as:  DELTASONE     TAKE these medications   cyclobenzaprine 10 MG tablet Commonly known as:  FLEXERIL Take 1 tablet (10 mg total) by mouth 3 (three) times daily as needed for muscle spasms. What changed:    medication strength  how much to take  when to take this   HYDROcodone-acetaminophen 5-325 MG tablet Commonly known as:  NORCO/VICODIN Take 1 tablet by mouth every 4 (four) hours as needed for moderate pain ((score 4 to 6)).   metFORMIN 500 MG tablet Commonly known as:  GLUCOPHAGE Take 1 tablet (500 mg total) by mouth 2 (two) times daily with a meal.   rosuvastatin 10 MG tablet Commonly known as:  CRESTOR Take 1 tablet (10 mg total) by mouth daily. To lower cholesterol        Signed: Sherilyn Cooter  A Alaija Ruble 01/20/2019, 12:19 PM

## 2019-01-20 NOTE — Progress Notes (Signed)
CSW alerted that patient has received authorization to admit to [facility], and they will need her to arrive tomorrow morning. CSW contacted Neurosurgery office and left a message with secretary Morrie Sheldon for Dr. Jordan Likes to inform him to complete discharge for her for tomorrow morning.  Awaiting call back.   Blenda Nicely, [place] Clinical Social Worker [phone]

## 2019-01-20 NOTE — Progress Notes (Signed)
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  PMH: DM.     PT Comments    Pt presented in bed with daughter present. Pt agreed to perform therex and transfer training. Pt able to recal 2/3 back precautions. Unable to recall twisting and once at EOB required cue to avoid twisting. Pt min A with rolling and mod A with side-lying to sit. Pt requires mod A +2 for powering up off EOB to stand, and requires cues for hand placement and posture once in standing. Pt requires multimodal cues to move feet toward R side when performing stand and pivot transfer to recliner chair. Pt d/c plan to CIR is appropriate based on current progress. Plan to progress pt with transfer training and standing activities as tolerated and ready. Pt left with daughter in room. Pt in chair with alarm set, phone/call bell, and all other needs within reach.   Follow Up Recommendations  CIR;Supervision for mobility/OOB;Supervision/Assistance - 24 hour     Equipment Recommendations  Rolling walker with 5" wheels    Recommendations for Other Services       Precautions / Restrictions Precautions Precautions: Back;Fall Precaution Booklet Issued: No Precaution Comments: reviewed back precautions with pt  Restrictions Weight Bearing Restrictions: No    Mobility  Bed Mobility Overal bed mobility: Needs Assistance Bed Mobility: Rolling;Sidelying to Sit Rolling: Min assist Sidelying to sit: Mod assist       General bed mobility comments: increased time and effort, cueing for log roll technique, assistance with trunk elevation and cueing to scoot forwards to EOB  Transfers Overall transfer level: Needs assistance Equipment used: Rolling walker (2  wheeled) Transfers: Sit to/from [employer] Sit to Stand: From elevated surface;+2 physical assistance;Mod assist Stand pivot transfers: +2 physical assistance;Mod assist       General transfer comment: cueing for safe hand placement, assistance needed to power into standing from EOB, assistance and cueing needed to take pivotal steps to chair towards her R side  Ambulation/Gait             General Gait Details: Unable   Stairs             Wheelchair Mobility    Modified Rankin (Stroke Patients Only)       Balance Overall balance assessment: Needs assistance Sitting-balance support: Feet supported Sitting balance-Leahy Scale: Fair     Standing balance support: During functional activity;Bilateral upper extremity supported Standing balance-Leahy Scale: Poor Standing balance comment: reliant on BUE and external support                            Cognition Arousal/Alertness: Awake/alert Behavior During Therapy: WFL for tasks assessed/performed Overall Cognitive Status: Within Functional Limits for tasks assessed                                        Exercises General Exercises - Lower Extremity Quad Sets: AROM;10 reps;Supine;Both Heel Slides: AROM;10 reps;Both;Supine Hip ABduction/ADduction: AROM;AAROM;10 reps;Both;Supine(AAROM for ABD on LLE) Other Exercises Other Exercises: BLE heel cord stretch 2 x 30 sec ea    General Comments  Pertinent Vitals/Pain Pain Score: 6  Pain Location: back and into BLEs Pain Descriptors / Indicators: Sore;Operative site guarding    Home Living                      Prior Function            PT Goals (current goals can now be found in the care plan section) Acute Rehab PT Goals Patient Stated Goal: to get back to my normal and be able to walk PT Goal Formulation: With patient Potential to Achieve Goals: Good    Frequency    Min 5X/week      PT  Plan Current plan remains appropriate    Co-evaluation              AM-PAC PT "6 Clicks" Mobility   Outcome Measure  Help needed turning from your back to your side while in a flat bed without using bedrails?: A Little Help needed moving from lying on your back to sitting on the side of a flat bed without using bedrails?: A Little Help needed moving to and from a bed to a chair (including a wheelchair)?: A Lot Help needed standing up from a chair using your arms (e.g., wheelchair or bedside chair)?: A Lot Help needed to walk in hospital room?: Total Help needed climbing 3-5 steps with a railing? : Total 6 Click Score: 12    End of Session Equipment Utilized During Treatment: Gait belt Activity Tolerance: Patient tolerated treatment well Patient left: in chair;with call bell/phone within reach;with family/visitor present Nurse Communication: Mobility status PT Visit Diagnosis: Pain;Muscle weakness (generalized) (M62.81);Other abnormalities of gait and mobility (R26.89);Unsteadiness on feet (R26.81)     Time: 5956-3875 PT Time Calculation (min) (ACUTE ONLY): 30 min  Charges:  $Therapeutic Exercise: 8-22 mins $Therapeutic Activity: 8-22 mins                     Margarita Mail, SPTA   Margarita Mail 01/20/2019, 5:27 PM

## 2019-01-20 NOTE — Progress Notes (Addendum)
Received notice from [facility] IP rehab that pt has been approved by her insurance for IP rehab. [facility] is offering bed and has bed available however pt would need to be there in house by 1pm today. MD has been paged and will plan for transition tomorrow AM via [facility] to [facility] IP rehab. Pam- admissions coordinator aware and will plan on admission for 01/21/19. CM has spoken with pt at bedside to let her know of approval and plan for transition.   Nurse Line for RN to call report  Prior to d/c- [phone]  CM/CSW to arrange for [facility] transport on 01/21/19 Fax for d/c summary- [phone]  01/21/19- d/c summary and MAR have been faxed to [facility] IP rehab per request by Pam/

## 2019-01-20 NOTE — Discharge Instructions (Signed)

## 2019-01-20 NOTE — Progress Notes (Signed)
Postop day 3.  Patient situation otherwise stable.  Minimal back pain.  Still with significant numbness paresthesias and weakness in both distal lower extremities.  Still with absent sacral sensation.  Afebrile.  Vital signs are stable.  Awake and alert.  Oriented and appropriate.  Motor examination intact except patient with no dorsiflexion bilaterally.  She has some weak plantarflexion returning right greater than left.  Sensory examination with dense sensory loss in L5 dermatomes distally.  Wound clean and dry.  Overall progressing reasonably well.  Patient awaiting determination of [facility].  Continue current management.

## 2019-01-21 LAB — GLUCOSE, CAPILLARY: GLUCOSE-CAPILLARY: 94 mg/dL (ref 70–99)

## 2019-01-21 NOTE — Progress Notes (Signed)
IV discontinued without complication. Patient education provided to patient.  Report called to RN Gilda at [facility]. Patient discharged from facility via wheelchair.

## 2019-01-21 NOTE — Progress Notes (Signed)
Physical Therapy Treatment Patient Details Name: Danielle Moreno MRN: [id number] DOB: 17-Aug-1976 Today's Date: 01/21/2019    History of Present Illness Pt is a 43 y/o female s/p L4-5 decompressive laminotomy with R sided L4-5 microdiscectomy after large R sided L4-5 paracentral disc herniation with severe stenosis.  PMH: DM.     PT Comments    Pt supine on arrival and eager to mobilize.  Pt able to recall 3/3 precautions.  Pt required cueing for technique to maintain spinal precautions.  Pt able to progress to short gait trial with close chair follow.  She is limited due to severe ataxia and scissoring.  Pt reports feeling hot in standing and returned to seated position aftr short gt trial.  Plan in place for transport to [facility].  Pt remains appropriate for intensive therapies to improve strength and function and return home in a more independent manner.      Follow Up Recommendations  CIR;Supervision for mobility/OOB;Supervision/Assistance - 24 hour     Equipment Recommendations  Rolling walker with 5" wheels    Recommendations for Other Services       Precautions / Restrictions Precautions Precautions: Back;Fall Precaution Booklet Issued: No Precaution Comments: reviewed back precautions with pt  Required Braces or Orthoses: (no brace needed per MD order) Restrictions Weight Bearing Restrictions: No    Mobility  Bed Mobility Overal bed mobility: Needs Assistance Bed Mobility: Rolling;Sidelying to Sit   Sidelying to sit: Mod assist       General bed mobility comments: increased time and effort, cueing for log roll technique, assistance with trunk elevation and cueing to scoot forwards to EOB  Transfers Overall transfer level: Needs assistance Equipment used: Rolling walker (2 wheeled) Transfers: Sit to/from Stand Sit to Stand: Mod assist         General transfer comment: Cues for hand placement to push from seated surface.  Pt slow to ascend  to standing.  Required moderate assistance to boost into standing.    Ambulation/Gait Ambulation/Gait assistance: Mod assist;+2 physical assistance;+2 safety/equipment Gait Distance (Feet): 6 Feet Assistive device: Rolling walker (2 wheeled) Gait Pattern/deviations: Step-to pattern;Ataxic;Decreased step length - right;Decreased step length - left;Decreased dorsiflexion - right;Decreased dorsiflexion - left;Scissoring;Narrow base of support     General Gait Details: Pt required cues to increase BOS as she fatigues BOS decreased and noted to step R foot on L foot required close chair follow as she fatigues quickly.     Stairs             Wheelchair Mobility    Modified Rankin (Stroke Patients Only)       Balance Overall balance assessment: Needs assistance   Sitting balance-Leahy Scale: Fair       Standing balance-Leahy Scale: Poor                              Cognition Arousal/Alertness: Awake/alert Behavior During Therapy: WFL for tasks assessed/performed Overall Cognitive Status: Within Functional Limits for tasks assessed                                        Exercises      General Comments        Pertinent Vitals/Pain Pain Assessment: Faces Faces Pain Scale: Hurts little more Pain Location: back and into BLEs Pain Descriptors / Indicators: Discomfort Pain  Intervention(s): Monitored during session;Repositioned    Home Living                      Prior Function            PT Goals (current goals can now be found in the care plan section) Acute Rehab PT Goals Patient Stated Goal: to get back to my normal and be able to walk Potential to Achieve Goals: Good Progress towards PT goals: Progressing toward goals    Frequency    Min 5X/week      PT Plan Current plan remains appropriate    Co-evaluation              AM-PAC PT "6 Clicks" Mobility   Outcome Measure  Help needed turning from your  back to your side while in a flat bed without using bedrails?: A Little Help needed moving from lying on your back to sitting on the side of a flat bed without using bedrails?: A Lot Help needed moving to and from a bed to a chair (including a wheelchair)?: A Lot Help needed standing up from a chair using your arms (e.g., wheelchair or bedside chair)?: A Lot Help needed to walk in hospital room?: A Lot Help needed climbing 3-5 steps with a railing? : Total 6 Click Score: 12    End of Session Equipment Utilized During Treatment: Gait belt Activity Tolerance: Patient tolerated treatment well Patient left: in chair;with call bell/phone within reach;with family/visitor present;with chair alarm set Nurse Communication: Mobility status PT Visit Diagnosis: Pain;Muscle weakness (generalized) (M62.81);Other abnormalities of gait and mobility (R26.89);Unsteadiness on feet (R26.81) Pain - part of body: (back)     Time: 8466-5993 PT Time Calculation (min) (ACUTE ONLY): 24 min  Charges:  $Gait Training: 8-22 mins $Therapeutic Activity: 8-22 mins                     Joycelyn Rua, PTA Acute Rehabilitation Services Pager [phone] Office [phone]     Eilyn Polack Artis Delay 01/21/2019, 9:28 AM

## 2019-01-21 NOTE — Progress Notes (Signed)
Discharge to: [facility], Inpatient Rehab Anticipated discharge date: 01/21/19 Transportation by: [facility]  Report #: [phone]  CSW signing off.  Blenda Nicely LCSW [phone]

## 2019-04-14 MED FILL — ?NAPROXEN 500 MG TABET: 500 | 30 days supply | Qty: 60 | Fill #1

## 2020-01-02 ENCOUNTER — Encounter (HOSPITAL_COMMUNITY): Payer: Self-pay | Admitting: Emergency Medicine

## 2020-01-02 ENCOUNTER — Inpatient Hospital Stay (HOSPITAL_COMMUNITY): Payer: 59

## 2020-01-02 ENCOUNTER — Inpatient Hospital Stay (HOSPITAL_COMMUNITY)
Admission: EM | Admit: 2020-01-02 | Discharge: 2020-01-16 | DRG: 871 | Disposition: E | Payer: 59 | Attending: Internal Medicine | Admitting: Internal Medicine

## 2020-01-02 ENCOUNTER — Emergency Department (HOSPITAL_COMMUNITY): Payer: 59

## 2020-01-02 ENCOUNTER — Other Ambulatory Visit: Payer: Self-pay

## 2020-01-02 DIAGNOSIS — Z4659 Encounter for fitting and adjustment of other gastrointestinal appliance and device: Secondary | ICD-10-CM

## 2020-01-02 DIAGNOSIS — Y92230 Patient room in hospital as the place of occurrence of the external cause: Secondary | ICD-10-CM | POA: Diagnosis present

## 2020-01-02 DIAGNOSIS — Z20822 Contact with and (suspected) exposure to covid-19: Secondary | ICD-10-CM | POA: Diagnosis present

## 2020-01-02 DIAGNOSIS — J9600 Acute respiratory failure, unspecified whether with hypoxia or hypercapnia: Secondary | ICD-10-CM | POA: Diagnosis present

## 2020-01-02 DIAGNOSIS — R6521 Severe sepsis with septic shock: Secondary | ICD-10-CM | POA: Diagnosis present

## 2020-01-02 DIAGNOSIS — E1110 Type 2 diabetes mellitus with ketoacidosis without coma: Secondary | ICD-10-CM | POA: Diagnosis present

## 2020-01-02 DIAGNOSIS — F329 Major depressive disorder, single episode, unspecified: Secondary | ICD-10-CM | POA: Diagnosis present

## 2020-01-02 DIAGNOSIS — E1111 Type 2 diabetes mellitus with ketoacidosis with coma: Secondary | ICD-10-CM | POA: Diagnosis present

## 2020-01-02 DIAGNOSIS — G834 Cauda equina syndrome: Secondary | ICD-10-CM | POA: Diagnosis present

## 2020-01-02 DIAGNOSIS — G92 Toxic encephalopathy: Secondary | ICD-10-CM | POA: Diagnosis present

## 2020-01-02 DIAGNOSIS — I10 Essential (primary) hypertension: Secondary | ICD-10-CM | POA: Diagnosis present

## 2020-01-02 DIAGNOSIS — Z86718 Personal history of other venous thrombosis and embolism: Secondary | ICD-10-CM

## 2020-01-02 DIAGNOSIS — Z79899 Other long term (current) drug therapy: Secondary | ICD-10-CM

## 2020-01-02 DIAGNOSIS — R339 Retention of urine, unspecified: Secondary | ICD-10-CM | POA: Diagnosis present

## 2020-01-02 DIAGNOSIS — N170 Acute kidney failure with tubular necrosis: Secondary | ICD-10-CM | POA: Diagnosis present

## 2020-01-02 DIAGNOSIS — Z7982 Long term (current) use of aspirin: Secondary | ICD-10-CM | POA: Diagnosis not present

## 2020-01-02 DIAGNOSIS — J9601 Acute respiratory failure with hypoxia: Secondary | ICD-10-CM | POA: Diagnosis not present

## 2020-01-02 DIAGNOSIS — Z7984 Long term (current) use of oral hypoglycemic drugs: Secondary | ICD-10-CM | POA: Diagnosis not present

## 2020-01-02 DIAGNOSIS — I469 Cardiac arrest, cause unspecified: Secondary | ICD-10-CM | POA: Diagnosis not present

## 2020-01-02 DIAGNOSIS — E669 Obesity, unspecified: Secondary | ICD-10-CM | POA: Diagnosis present

## 2020-01-02 DIAGNOSIS — Z6837 Body mass index (BMI) 37.0-37.9, adult: Secondary | ICD-10-CM

## 2020-01-02 DIAGNOSIS — R571 Hypovolemic shock: Secondary | ICD-10-CM | POA: Diagnosis present

## 2020-01-02 DIAGNOSIS — D696 Thrombocytopenia, unspecified: Secondary | ICD-10-CM | POA: Diagnosis present

## 2020-01-02 DIAGNOSIS — E785 Hyperlipidemia, unspecified: Secondary | ICD-10-CM | POA: Diagnosis present

## 2020-01-02 DIAGNOSIS — N179 Acute kidney failure, unspecified: Secondary | ICD-10-CM

## 2020-01-02 DIAGNOSIS — T4275XA Adverse effect of unspecified antiepileptic and sedative-hypnotic drugs, initial encounter: Secondary | ICD-10-CM | POA: Diagnosis present

## 2020-01-02 DIAGNOSIS — R579 Shock, unspecified: Secondary | ICD-10-CM | POA: Diagnosis not present

## 2020-01-02 DIAGNOSIS — I959 Hypotension, unspecified: Secondary | ICD-10-CM | POA: Diagnosis not present

## 2020-01-02 DIAGNOSIS — A419 Sepsis, unspecified organism: Secondary | ICD-10-CM | POA: Diagnosis present

## 2020-01-02 DIAGNOSIS — E875 Hyperkalemia: Secondary | ICD-10-CM | POA: Diagnosis present

## 2020-01-02 DIAGNOSIS — F17210 Nicotine dependence, cigarettes, uncomplicated: Secondary | ICD-10-CM | POA: Diagnosis present

## 2020-01-02 DIAGNOSIS — Z7901 Long term (current) use of anticoagulants: Secondary | ICD-10-CM

## 2020-01-02 DIAGNOSIS — E870 Hyperosmolality and hypernatremia: Secondary | ICD-10-CM | POA: Diagnosis present

## 2020-01-02 DIAGNOSIS — Z833 Family history of diabetes mellitus: Secondary | ICD-10-CM

## 2020-01-02 DIAGNOSIS — I428 Other cardiomyopathies: Secondary | ICD-10-CM | POA: Diagnosis not present

## 2020-01-02 HISTORY — DX: Cauda equina syndrome: G83.4

## 2020-01-02 HISTORY — DX: Displacement of indwelling urethral catheter, initial encounter: T83.021A

## 2020-01-02 LAB — CBC
HCT: 54.3 % — ABNORMAL HIGH (ref 36.0–46.0)
Hemoglobin: 15.2 g/dL — ABNORMAL HIGH (ref 12.0–15.0)
MCH: 29 pg (ref 26.0–34.0)
MCHC: 28 g/dL — ABNORMAL LOW (ref 30.0–36.0)
MCV: 103.6 fL — ABNORMAL HIGH (ref 80.0–100.0)
Platelets: 489 10*3/uL — ABNORMAL HIGH (ref 150–400)
RBC: 5.24 MIL/uL — ABNORMAL HIGH (ref 3.87–5.11)
RDW: 15 % (ref 11.5–15.5)
WBC: 30.8 10*3/uL — ABNORMAL HIGH (ref 4.0–10.5)
nRBC: 0.1 % (ref 0.0–0.2)

## 2020-01-02 LAB — CBG MONITORING, ED
Glucose-Capillary: >600 mg/dL (ref 70–99)
Glucose-Capillary: >600 mg/dL (ref 70–99)

## 2020-01-02 LAB — POCT I-STAT 7, (LYTES, BLD GAS, ICA,H+H)
Acid-base deficit: 12 mmol/L — ABNORMAL HIGH (ref 0.0–2.0)
Acid-base deficit: 14 mmol/L — ABNORMAL HIGH (ref 0.0–2.0)
Acid-base deficit: 12 mmol/L — ABNORMAL HIGH (ref 0.0–2.0)
Bicarbonate: 18.1 mmol/L — ABNORMAL LOW (ref 20.0–28.0)
Bicarbonate: 11.1 mmol/L — ABNORMAL LOW (ref 20.0–28.0)
Bicarbonate: 13.1 mmol/L — ABNORMAL LOW (ref 20.0–28.0)
Calcium, Ion: 1.23 mmol/L (ref 1.15–1.40)
Calcium, Ion: 1.19 mmol/L (ref 1.15–1.40)
Calcium, Ion: 1.17 mmol/L (ref 1.15–1.40)
HCT: 42 % (ref 36.0–46.0)
HCT: 42 % (ref 36.0–46.0)
HCT: 41 % (ref 36.0–46.0)
Hemoglobin: 14.3 g/dL (ref 12.0–15.0)
Hemoglobin: 14.3 g/dL (ref 12.0–15.0)
Hemoglobin: 13.9 g/dL (ref 12.0–15.0)
O2 Saturation: 100 %
O2 Saturation: 100 %
O2 Saturation: 98 %
Patient temperature: 97.2
Patient temperature: 104
Patient temperature: 101.7
Potassium: 4.8 mmol/L (ref 3.5–5.1)
Potassium: 4.2 mmol/L (ref 3.5–5.1)
Potassium: 3.9 mmol/L (ref 3.5–5.1)
Sodium: 137 mmol/L (ref 135–145)
Sodium: 143 mmol/L (ref 135–145)
Sodium: 148 mmol/L — ABNORMAL HIGH (ref 135–145)
TCO2: 20 mmol/L — ABNORMAL LOW (ref 22–32)
TCO2: 12 mmol/L — ABNORMAL LOW (ref 22–32)
TCO2: 14 mmol/L — ABNORMAL LOW (ref 22–32)
pCO2 arterial: 53.7 mmHg — ABNORMAL HIGH (ref 32.0–48.0)
pCO2 arterial: 28.7 mmHg — ABNORMAL LOW (ref 32.0–48.0)
pCO2 arterial: 29.3 mmHg — ABNORMAL LOW (ref 32.0–48.0)
pH, Arterial: 7.132 — CL (ref 7.350–7.450)
pH, Arterial: 7.212 — ABNORMAL LOW (ref 7.350–7.450)
pH, Arterial: 7.265 — ABNORMAL LOW (ref 7.350–7.450)
pO2, Arterial: 443 mmHg — ABNORMAL HIGH (ref 83.0–108.0)
pO2, Arterial: 216 mmHg — ABNORMAL HIGH (ref 83.0–108.0)
pO2, Arterial: 120 mmHg — ABNORMAL HIGH (ref 83.0–108.0)

## 2020-01-02 LAB — BASIC METABOLIC PANEL
Anion gap: 28 — ABNORMAL HIGH (ref 5–15)
Anion gap: 25 — ABNORMAL HIGH (ref 5–15)
Anion gap: 20 — ABNORMAL HIGH (ref 5–15)
BUN: 33 mg/dL — ABNORMAL HIGH (ref 6–20)
BUN: 35 mg/dL — ABNORMAL HIGH (ref 6–20)
BUN: 31 mg/dL — ABNORMAL HIGH (ref 6–20)
CO2: 13 mmol/L — ABNORMAL LOW (ref 22–32)
CO2: 15 mmol/L — ABNORMAL LOW (ref 22–32)
CO2: 12 mmol/L — ABNORMAL LOW (ref 22–32)
Calcium: 10 mg/dL (ref 8.9–10.3)
Calcium: 8.6 mg/dL — ABNORMAL LOW (ref 8.9–10.3)
Calcium: 8.4 mg/dL — ABNORMAL LOW (ref 8.9–10.3)
Chloride: 88 mmol/L — ABNORMAL LOW (ref 98–111)
Chloride: 107 mmol/L (ref 98–111)
Chloride: 114 mmol/L — ABNORMAL HIGH (ref 98–111)
Creatinine, Ser: 3.37 mg/dL — ABNORMAL HIGH (ref 0.44–1.00)
Creatinine, Ser: 3.74 mg/dL — ABNORMAL HIGH (ref 0.44–1.00)
Creatinine, Ser: 3.96 mg/dL — ABNORMAL HIGH (ref 0.44–1.00)
GFR calc Af Amer: 18 mL/min — ABNORMAL LOW (ref >60)
GFR calc Af Amer: 16 mL/min — ABNORMAL LOW (ref >60)
GFR calc Af Amer: 15 mL/min — ABNORMAL LOW (ref >60)
GFR calc non Af Amer: 16 mL/min — ABNORMAL LOW (ref >60)
GFR calc non Af Amer: 14 mL/min — ABNORMAL LOW (ref >60)
GFR calc non Af Amer: 13 mL/min — ABNORMAL LOW (ref >60)
Glucose, Bld: 1719 mg/dL (ref 70–99)
Glucose, Bld: 829 mg/dL (ref 70–99)
Glucose, Bld: 538 mg/dL (ref 70–99)
Potassium: 5.8 mmol/L — ABNORMAL HIGH (ref 3.5–5.1)
Potassium: 4.1 mmol/L (ref 3.5–5.1)
Potassium: 4.2 mmol/L (ref 3.5–5.1)
Sodium: 129 mmol/L — ABNORMAL LOW (ref 135–145)
Sodium: 147 mmol/L — ABNORMAL HIGH (ref 135–145)
Sodium: 146 mmol/L — ABNORMAL HIGH (ref 135–145)

## 2020-01-02 LAB — HEPATIC FUNCTION PANEL
ALT: 23 U/L (ref 0–44)
AST: 31 U/L (ref 15–41)
Albumin: 2.5 g/dL — ABNORMAL LOW (ref 3.5–5.0)
Alkaline Phosphatase: 141 U/L — ABNORMAL HIGH (ref 38–126)
Bilirubin, Direct: 0.6 mg/dL — ABNORMAL HIGH (ref 0.0–0.2)
Indirect Bilirubin: 0.7 mg/dL (ref 0.3–0.9)
Total Bilirubin: 1.3 mg/dL — ABNORMAL HIGH (ref 0.3–1.2)
Total Protein: 6.8 g/dL (ref 6.5–8.1)

## 2020-01-02 LAB — I-STAT CHEM 8, ED
BUN: 51 mg/dL — ABNORMAL HIGH (ref 6–20)
Calcium, Ion: 1.12 mmol/L — ABNORMAL LOW (ref 1.15–1.40)
Chloride: 98 mmol/L (ref 98–111)
Creatinine, Ser: 2.8 mg/dL — ABNORMAL HIGH (ref 0.44–1.00)
Glucose, Bld: >700 mg/dL (ref 70–99)
HCT: 52 % — ABNORMAL HIGH (ref 36.0–46.0)
Hemoglobin: 17.7 g/dL — ABNORMAL HIGH (ref 12.0–15.0)
Potassium: 6 mmol/L — ABNORMAL HIGH (ref 3.5–5.1)
Sodium: 130 mmol/L — ABNORMAL LOW (ref 135–145)
TCO2: 16 mmol/L — ABNORMAL LOW (ref 22–32)

## 2020-01-02 LAB — RESPIRATORY PANEL BY RT PCR (FLU A&B, COVID)
Influenza A by PCR: NEGATIVE
Influenza B by PCR: NEGATIVE
SARS Coronavirus 2 by RT PCR: NEGATIVE

## 2020-01-02 LAB — TROPONIN I (HIGH SENSITIVITY)
Troponin I (High Sensitivity): 88 ng/L — ABNORMAL HIGH (ref <18)
Troponin I (High Sensitivity): 222 ng/L (ref <18)

## 2020-01-02 LAB — GLUCOSE, CAPILLARY
Glucose-Capillary: >600 mg/dL (ref 70–99)
Glucose-Capillary: >600 mg/dL (ref 70–99)
Glucose-Capillary: >600 mg/dL (ref 70–99)
Glucose-Capillary: >600 mg/dL (ref 70–99)
Glucose-Capillary: >600 mg/dL (ref 70–99)
Glucose-Capillary: >600 mg/dL (ref 70–99)
Glucose-Capillary: >600 mg/dL (ref 70–99)

## 2020-01-02 LAB — CORTISOL: Cortisol, Plasma: 38 ug/dL

## 2020-01-02 LAB — LACTIC ACID, PLASMA
Lactic Acid, Venous: >11 mmol/L (ref 0.5–1.9)
Lactic Acid, Venous: 9.3 mmol/L (ref 0.5–1.9)

## 2020-01-02 LAB — I-STAT BETA HCG BLOOD, ED (MC, WL, AP ONLY): I-stat hCG, quantitative: <5 m[IU]/mL (ref <5)

## 2020-01-02 LAB — MRSA PCR SCREENING: MRSA by PCR: NEGATIVE

## 2020-01-02 LAB — APTT: aPTT: 23 seconds — ABNORMAL LOW (ref 24–36)

## 2020-01-02 LAB — HIV ANTIBODY (ROUTINE TESTING W REFLEX): HIV Screen 4th Generation wRfx: NONREACTIVE

## 2020-01-02 LAB — OSMOLALITY: Osmolality: 399 mOsm/kg (ref 275–295)

## 2020-01-02 MED ORDER — LACTATED RINGERS IV BOLUS
2000.0000 mL | Freq: Once | INTRAVENOUS | Status: AC
  Administered 2020-01-02: 2000 mL via INTRAVENOUS

## 2020-01-02 MED ORDER — SODIUM CHLORIDE 0.9 % IV SOLN
2.0000 g | Freq: Once | INTRAVENOUS | Status: AC
  Administered 2020-01-02: 12:00:00 2 g via INTRAVENOUS
  Filled 2020-01-02: qty 2

## 2020-01-02 MED ORDER — CHLORHEXIDINE GLUCONATE 0.12% ORAL RINSE (MEDLINE KIT)
15.0000 mL | Freq: Two times a day (BID) | OROMUCOSAL | Status: DC
  Administered 2020-01-02 – 2020-01-04 (×4): 15 mL via OROMUCOSAL

## 2020-01-02 MED ORDER — VANCOMYCIN HCL 2000 MG/400ML IV SOLN
2000.0000 mg | Freq: Once | INTRAVENOUS | Status: AC
  Administered 2020-01-02: 2000 mg via INTRAVENOUS
  Filled 2020-01-02: qty 400

## 2020-01-02 MED ORDER — PHENYLEPHRINE 40 MCG/ML (10ML) SYRINGE FOR IV PUSH (FOR BLOOD PRESSURE SUPPORT)
40.0000 ug | PREFILLED_SYRINGE | Freq: Once | INTRAVENOUS | Status: AC
  Administered 2020-01-02: 40 ug via INTRAVENOUS

## 2020-01-02 MED ORDER — ACETAMINOPHEN 325 MG PO TABS
650.0000 mg | ORAL_TABLET | Freq: Four times a day (QID) | ORAL | Status: DC | PRN
  Administered 2020-01-02 (×2): 650 mg
  Filled 2020-01-02 (×2): qty 2

## 2020-01-02 MED ORDER — SODIUM CHLORIDE 0.9 % IV SOLN: 1.0000 g | Freq: Once | INTRAVENOUS | Status: DC

## 2020-01-02 MED ORDER — PIPERACILLIN-TAZOBACTAM 3.375 G IVPB
3.3750 g | Freq: Three times a day (TID) | INTRAVENOUS | Status: DC
  Administered 2020-01-03: 3.375 g via INTRAVENOUS
  Filled 2020-01-02 (×2): qty 50

## 2020-01-02 MED ORDER — MIDAZOLAM HCL 2 MG/2ML IJ SOLN: 2.0000 mg | INTRAMUSCULAR | Status: DC | PRN

## 2020-01-02 MED ORDER — SODIUM CHLORIDE 0.9 % IV SOLN: 2.0000 g | Freq: Two times a day (BID) | INTRAVENOUS | Status: DC

## 2020-01-02 MED ORDER — SODIUM CHLORIDE 0.9 % IV SOLN
250.0000 mL | INTRAVENOUS | Status: DC
  Administered 2020-01-02: 16:00:00 250 mL via INTRAVENOUS

## 2020-01-02 MED ORDER — SODIUM BICARBONATE 8.4 % IV SOLN
50.0000 meq | Freq: Once | INTRAVENOUS | Status: AC
  Administered 2020-01-02: 10:00:00 50 meq via INTRAVENOUS
  Filled 2020-01-02: qty 50

## 2020-01-02 MED ORDER — VANCOMYCIN HCL 1500 MG/300ML IV SOLN: 1500.0000 mg | INTRAVENOUS | Status: DC

## 2020-01-02 MED ORDER — SODIUM CHLORIDE 0.9% FLUSH
3.0000 mL | Freq: Once | INTRAVENOUS | Status: AC
  Administered 2020-01-02: 3 mL via INTRAVENOUS

## 2020-01-02 MED ORDER — VANCOMYCIN HCL IN DEXTROSE 1-5 GM/200ML-% IV SOLN: 1000.0000 mg | Freq: Once | INTRAVENOUS | Status: DC

## 2020-01-02 MED ORDER — METRONIDAZOLE IN NACL 5-0.79 MG/ML-% IV SOLN
500.0000 mg | Freq: Once | INTRAVENOUS | Status: AC
  Administered 2020-01-02: 500 mg via INTRAVENOUS
  Filled 2020-01-02: qty 100

## 2020-01-02 MED ORDER — CHLORHEXIDINE GLUCONATE 0.12% ORAL RINSE (MEDLINE KIT): 15.0000 mL | Freq: Two times a day (BID) | OROMUCOSAL | Status: DC

## 2020-01-02 MED ORDER — ORAL CARE MOUTH RINSE
15.0000 mL | OROMUCOSAL | Status: DC
  Administered 2020-01-02 – 2020-01-04 (×18): 15 mL via OROMUCOSAL

## 2020-01-02 MED ORDER — DULOXETINE HCL 30 MG PO CPEP: 30.0000 mg | ORAL_CAPSULE | Freq: Every day | ORAL | Status: DC

## 2020-01-02 MED ORDER — ETOMIDATE 2 MG/ML IV SOLN
INTRAVENOUS | Status: AC | PRN
  Administered 2020-01-02: 20 mg via INTRAVENOUS

## 2020-01-02 MED ORDER — DEXTROSE-NACL 5-0.45 % IV SOLN: INTRAVENOUS | Status: DC

## 2020-01-02 MED ORDER — PHENYLEPHRINE 40 MCG/ML (10ML) SYRINGE FOR IV PUSH (FOR BLOOD PRESSURE SUPPORT)
40.0000 ug | PREFILLED_SYRINGE | Freq: Once | INTRAVENOUS | Status: AC
  Administered 2020-01-02: 11:00:00 40 ug via INTRAVENOUS

## 2020-01-02 MED ORDER — NOREPINEPHRINE 4 MG/250ML-% IV SOLN
2.0000 ug/min | INTRAVENOUS | Status: DC
  Administered 2020-01-02: 10 ug/min via INTRAVENOUS
  Filled 2020-01-02 (×2): qty 250

## 2020-01-02 MED ORDER — VANCOMYCIN HCL 750 MG/150ML IV SOLN: 750.0000 mg | INTRAVENOUS | Status: DC

## 2020-01-02 MED ORDER — FAMOTIDINE 40 MG/5ML PO SUSR
20.0000 mg | Freq: Two times a day (BID) | ORAL | Status: DC
  Administered 2020-01-02 – 2020-01-03 (×2): 20 mg
  Filled 2020-01-02 (×2): qty 2.5

## 2020-01-02 MED ORDER — NOREPINEPHRINE 16 MG/250ML-% IV SOLN
2.0000 ug/min | INTRAVENOUS | Status: DC
  Administered 2020-01-02: 15 ug/min via INTRAVENOUS
  Administered 2020-01-02: 5 ug/min via INTRAVENOUS
  Administered 2020-01-03: 13:00:00 10 ug/min via INTRAVENOUS
  Filled 2020-01-02 (×3): qty 250

## 2020-01-02 MED ORDER — DEXTROSE 50 % IV SOLN: 0.0000 mL | INTRAVENOUS | Status: DC | PRN

## 2020-01-02 MED ORDER — FENTANYL BOLUS VIA INFUSION
50.0000 ug | INTRAVENOUS | Status: DC | PRN
  Filled 2020-01-02: qty 50

## 2020-01-02 MED ORDER — PHENYLEPHRINE 40 MCG/ML (10ML) SYRINGE FOR IV PUSH (FOR BLOOD PRESSURE SUPPORT)
PREFILLED_SYRINGE | INTRAVENOUS | Status: AC
  Administered 2020-01-02: 10:00:00 40 ug via INTRAVENOUS
  Filled 2020-01-02: qty 10

## 2020-01-02 MED ORDER — FENTANYL CITRATE (PF) 100 MCG/2ML IJ SOLN
50.0000 ug | Freq: Once | INTRAMUSCULAR | Status: AC
  Administered 2020-01-04: 01:00:00 50 ug via INTRAVENOUS
  Filled 2020-01-02 (×2): qty 2

## 2020-01-02 MED ORDER — LACTATED RINGERS IV BOLUS
3000.0000 mL | Freq: Once | INTRAVENOUS | Status: AC
  Administered 2020-01-02: 2000 mL via INTRAVENOUS

## 2020-01-02 MED ORDER — INSULIN ASPART 100 UNIT/ML ~~LOC~~ SOLN
10.0000 [IU] | Freq: Once | SUBCUTANEOUS | Status: AC
  Administered 2020-01-02: 10 [IU] via INTRAVENOUS

## 2020-01-02 MED ORDER — SODIUM CHLORIDE 0.9 % IV SOLN
1.0000 g | Freq: Once | INTRAVENOUS | Status: AC
  Administered 2020-01-02: 1 g via INTRAVENOUS
  Filled 2020-01-02: qty 10

## 2020-01-02 MED ORDER — ROCURONIUM BROMIDE 50 MG/5ML IV SOLN
INTRAVENOUS | Status: AC | PRN
  Administered 2020-01-02: 80 mg via INTRAVENOUS

## 2020-01-02 MED ORDER — PHENYLEPHRINE 40 MCG/ML (10ML) SYRINGE FOR IV PUSH (FOR BLOOD PRESSURE SUPPORT): 40.0000 ug | PREFILLED_SYRINGE | Freq: Once | INTRAVENOUS | Status: AC

## 2020-01-02 MED ORDER — VANCOMYCIN HCL 1250 MG/250ML IV SOLN: 1250.0000 mg | INTRAVENOUS | Status: DC

## 2020-01-02 MED ORDER — VASOPRESSIN 20 UNIT/ML IV SOLN
0.0400 [IU]/min | INTRAVENOUS | Status: DC
  Administered 2020-01-02 – 2020-01-03 (×2): 0.03 [IU]/min via INTRAVENOUS
  Administered 2020-01-04: 0.04 [IU]/min via INTRAVENOUS
  Filled 2020-01-02 (×3): qty 2

## 2020-01-02 MED ORDER — SODIUM CHLORIDE 0.9 % IV BOLUS
1000.0000 mL | INTRAVENOUS | Status: AC
  Administered 2020-01-02: 1000 mL via INTRAVENOUS

## 2020-01-02 MED ORDER — PIPERACILLIN-TAZOBACTAM 3.375 G IVPB 30 MIN
3.3750 g | Freq: Once | INTRAVENOUS | Status: AC
  Administered 2020-01-02: 3.375 g via INTRAVENOUS
  Filled 2020-01-02: qty 50

## 2020-01-02 MED ORDER — FENTANYL 2500MCG IN NS 250ML (10MCG/ML) PREMIX INFUSION: 50.0000 ug/h | INTRAVENOUS | Status: DC

## 2020-01-02 MED ORDER — CHLORHEXIDINE GLUCONATE CLOTH 2 % EX PADS: 6.0000 | MEDICATED_PAD | Freq: Every day | CUTANEOUS | Status: DC

## 2020-01-02 MED ORDER — LACTATED RINGERS IV SOLN: INTRAVENOUS | Status: DC

## 2020-01-02 MED ORDER — FENTANYL 2500MCG IN NS 250ML (10MCG/ML) PREMIX INFUSION
25.0000 ug/h | INTRAVENOUS | Status: DC
  Filled 2020-01-02: qty 250

## 2020-01-02 MED ORDER — INSULIN REGULAR(HUMAN) IN NACL 100-0.9 UT/100ML-% IV SOLN
INTRAVENOUS | Status: DC
  Administered 2020-01-02: 11.5 [IU]/h via INTRAVENOUS
  Administered 2020-01-03: 13:00:00 26 [IU]/h via INTRAVENOUS
  Administered 2020-01-03: 23 [IU]/h via INTRAVENOUS
  Administered 2020-01-03: 13 [IU]/h via INTRAVENOUS
  Administered 2020-01-03: 23 [IU]/h via INTRAVENOUS
  Filled 2020-01-02 (×7): qty 100

## 2020-01-02 MED ORDER — FENTANYL CITRATE (PF) 100 MCG/2ML IJ SOLN: 50.0000 ug | Freq: Once | INTRAMUSCULAR | Status: DC

## 2020-01-02 MED ORDER — SODIUM CHLORIDE 0.9 % IV SOLN: INTRAVENOUS | Status: DC

## 2020-01-02 MED ORDER — ORAL CARE MOUTH RINSE: 15.0000 mL | OROMUCOSAL | Status: DC

## 2020-01-02 MED ORDER — HEPARIN SODIUM (PORCINE) 5000 UNIT/ML IJ SOLN
5000.0000 [IU] | Freq: Three times a day (TID) | INTRAMUSCULAR | Status: DC
  Administered 2020-01-02 – 2020-01-03 (×2): 5000 [IU] via SUBCUTANEOUS
  Filled 2020-01-02 (×2): qty 1

## 2020-01-02 MED ORDER — SODIUM BICARBONATE 8.4 % IV SOLN
50.0000 meq | Freq: Once | INTRAVENOUS | Status: AC
  Administered 2020-01-02: 50 meq via INTRAVENOUS
  Filled 2020-01-02: qty 50

## 2020-01-02 NOTE — ED Notes (Signed)
PA notified of abnormal lactic, troponin and osmolality.

## 2020-01-02 NOTE — Procedures (Signed)
Arterial Catheter Insertion Procedure Note DIA DONATE [id number] Mar 24, 1976  Procedure: Insertion of Arterial Catheter  Indications: Blood pressure monitoring and Frequent blood sampling  Procedure Details Consent: obtained on prior attempt Time Out: Verified patient identification, verified procedure, site/side was marked, verified correct patient position, special equipment/implants available, medications/allergies/relevent history reviewed, required imaging and test results available.  Performed  Maximum sterile technique was used including antiseptics, cap, gloves, gown, hand hygiene, mask and sheet. Skin prep: Chlorhexidine; local anesthetic administered 20 gauge catheter was inserted into left radial artery using the Seldinger technique.  Two attempts to L radial artery, achieved blood flow but unable to thread wire.    Placed line at R femoral artery.  No complications.   ULTRASOUND GUIDANCE USED: YES Evaluation Blood flow good; BP tracing good. Complications: No apparent complications.   Charlotte Sanes 01/03/2020

## 2020-01-02 NOTE — Progress Notes (Signed)
Pharmacy Antibiotic Note  Danielle Moreno is a 44 y.o. female admitted on Jan 03, 2020 for unresponsiveness. Patient has been intubated in the ED. Pharmacy has been consulted for vancomycin and cefepime dosing for possible infection of unknown source.  Fever to 102 this afternoon.   Cefepime discontinued and pharmacy consulted to dose Zosyn and Vancomycin.  metronidazole continued per MD dosing.  Plan: - Zosyn 3.375 gm IV x1 for 30 min infustion Now, then 3.375 g IV q8h , 4hr infusion Vanc 2g load given @1232  on 2/15. then Vancomycin 1500 mg IV Q 48 hrs. Goal AUC 400-550. Expected AUC: 522 SCr used: 2.80 - Metronidazole per MD Follow renal function, cultures, de-escalation, and vancomycin levels as indicated  Height: 5\' 8"  (172.7 cm) Weight: 248 lb 14.4 oz (112.9 kg) IBW/kg (Calculated) : 63.9  Temp (24hrs), Avg:102.3 F (39.1 C), Min:97.5 F (36.4 C), Max:104 F (40 C)  Recent Labs  Lab 01-03-2020 0921 01-03-2020 0931 01/03/2020 1125  WBC 30.8*  --   --   CREATININE 3.37* 2.80*  --   LATICACIDVEN  --   --  >11.0*    Estimated Creatinine Clearance: 33.1 mL/min (A) (by C-G formula based on SCr of 2.8 mg/dL (H)).    No Known Allergies  Antimicrobials this admission: Vancomycin 2/15 >> Cefepime 2/15 >>2/15 Metronidazole 2/15 >> Zosyn 2/15>>  Dose adjustments this admission: None  Microbiology results: 2/15 BCx: sent 2/15 COVID/flu: sent 2/15 MRSA PCR: sent 2/15 resp cx: sent 2/15 Covid 19: negative  Thank you for allowing pharmacy to be a part of this patient's care. 3/15, RPh Clinical Pharmacist  Jan 03, 2020       5:26 PM  Please check [email or web address] for unit-specific pharmacist phone numbers

## 2020-01-02 NOTE — H&P (Signed)
NAME:  Danielle Moreno, MRN:  [id number], DOB:  March 13, 1976, LOS: 0 ADMISSION DATE:  12/20/2019,  REFERRING MD: Dr. Fredderick Phenix, ED,  CHIEF COMPLAINT: DKA, acute respiratory failure  Brief History   44 year old woman, cauda equina syndrome, chronic Foley catheter, remote DVT on Eliquis, diabetes.  Admitted with DKA, encephalopathy, intubated for airway protection.  History of present illness   44 year old woman with a history of cauda equina syndrome, Herniated disc disease post surgical decompression 03/2019 and for which she has a chronic Foley catheter, mild diabetes managed on metformin, hyperlipidemia.  Reportedly had a DVT after her spine surgery last year, on chronic Eliquis.  She has experienced 4 to 5 days of poor p.o. intake, extreme thirst nausea.  She was found by her husband to be unresponsive 2/15, snoring respirations.  EMS activated, found to be hyperglycemic.  In the ED unresponsive and ET intubated for airway protection. She developed hypotension post induction and intubation.  Lab evaluation consistent with DKA, uncontrolled diabetes, acute renal failure, severe anion gap metabolic acidosis, hyperkalemia.  No clear infectious prodrome or precipitant of her decompensation.  She received empiric antibiotics x1 in the emergency department.  Past Medical History   Past Medical History:  Diagnosis Date  . Diabetes mellitus without complication (HCC)   Cauda equina syndrome, herniated disc disease status post surgery 03/2019 DVT in the setting of her surgery 03/2019, on Eliquis Bladder dysfunction due to her cauda equina syndrome, chronic Foley catheter Hyperlipidemia   Significant Hospital Events     Consults:    Procedures:  ETT 2/15 >>  CVC 2/15 >>   Significant Diagnostic Tests:  Head CT 2/15 >> no acute findings to explain encephalopathy Chest x-ray 2/15 >> no infiltrates, ET tube in good position, bibasilar atelectasis  Micro Data:  Blood 2/15 >>  Respiratory 2/15  >> Urine 2/15 >>   Antimicrobials:  Vancomycin x1 2/15 Cefepime x1 2/15 Flagyl x1 2/15  Interim history/subjective:  Has remained hypotensive post induction for intubation despite aggressive IV fluid resuscitation.  Has received several pushes of phenylephrine  Objective   Blood pressure (!) 76/43, pulse (!) 124, resp. rate (!) 26, height 5\' 8"  (1.727 m), weight 112.9 kg, SpO2 100 %.    Vent Mode: PRVC FiO2 (%):  [60 %-100 %] 60 % Set Rate:  [18 bmp-26 bmp] 26 bmp Vt Set:  [510 mL] 510 mL PEEP:  [5 cmH20] 5 cmH20 Plateau Pressure:  [20 cmH20-22 cmH20] 22 cmH20  No intake or output data in the 24 hours ending 01/01/2020 1129 Filed Weights   12/28/2019 1034  Weight: 112.9 kg    Examination: General: Obese woman, intubated, ill-appearing HENT: ET tube in good position, pupils 3 mm, sluggish Lungs: Coarse bilaterally, no wheezes, decreased at both bases Cardiovascular: Regular, tachycardic 120s, distant, no murmur Abdomen: Obese, soft, nondistended with positive bowel sounds hypoactive Extremities: Trace pretibial edema Neuro: Completely unresponsive (did receive paralytics for intubation) GU: Foley catheter to be changed  Resolved Hospital Problem list     Assessment & Plan:  Acute respiratory failure requiring mechanical ventilation due to encephalopathy and inadequate airway protection PRVC 8 cc/kg.  Adjust respiratory rate for mixed acidosis Follow ABG Follow intermittent chest x-ray, clear at presentation COVID-19 PUI, screening.  Await viral testing results before determining destination ICU VAP prevention order set ordered PAD sedation protocol   DKA, first occurrence Insulin drip running, IV fluids to transition to D5 half-normal saline to facilitate drip Aggressive IV fluid resuscitation Frequent BMP.  Follow ABG Metformin on hold  Acute encephalopathy, toxic metabolic due to DKA.  Consider infection as a contributor/precipitant although no indication for source  at this time Sedation for mechanical ventilation PAD protocol, RASS goal -1 to -2, fentanyl infusion, Versed as needed Address underlying metabolic abnormalities No clear source of infection.  Check procalcitonin, blood and respiratory cultures pending.  Add on urine culture given her chronic Foley (changed today) Antibiotics given x1 in the ED, follow clinically off antibiotics for now  Shock.  In absence of obvious infection suspect this is hypovolemic plus effects of sedating medications. Aggressive IV fluid resuscitation Start norepinephrine in the ED, goal wean quickly CVC placement Follow for evidence of infection as above  Acute renal failure, presumed hypovolemia possible contribution of her DM.  Complicated by hyperkalemia, hypocalcemia Aggressive volume resuscitation Follow BMP, urine output for interval improvement  Cauda equina syndrome, chronic Foley catheter Changed 2/15 Urine culture Hold baclofen, Flexeril oxybutynin  Baseline hypertension and hyperlipidemia Hold propranolol and statin for now  History DVT May 2020 Hold Eliquis at this time  Depression Restart Cymbalta per tube    Best practice:  Diet: NPO Pain/Anxiety/Delirium protocol (if indicated): RASS -1 to -2, fentanyl, Versed as needed VAP protocol (if indicated): Ordered DVT prophylaxis: Heparin subcutaneous GI prophylaxis: Pepcid Glucose control: Insulin infusion Mobility: BR Code Status: Full Family Communication: Discussed with the patient's husband at bedside 2/15 Disposition: ICU  Labs   CBC: Recent Labs  Lab 01/20/20 0921 2020/01/20 0931 01-20-2020 1045  WBC 30.8*  --   --   HGB 15.2* 17.7* 14.3  HCT 54.3* 52.0* 42.0  MCV 103.6*  --   --   PLT 489*  --   --     Basic Metabolic Panel: Recent Labs  Lab Jan 20, 2020 0921 01-20-2020 0931 Jan 20, 2020 1045  NA 129* 130* 137  K 5.8* 6.0* 4.8  CL 88* 98  --   CO2 13*  --   --   GLUCOSE 1,719* >700*  --   BUN 33* 51*  --   CREATININE  3.37* 2.80*  --   CALCIUM 10.0  --   --    GFR: Estimated Creatinine Clearance: 33.1 mL/min (A) (by C-G formula based on SCr of 2.8 mg/dL (H)). Recent Labs  Lab January 20, 2020 0921  WBC 30.8*    Liver Function Tests: Recent Labs  Lab 01/20/20 0921  AST 31  ALT 23  ALKPHOS 141*  BILITOT 1.3*  PROT 6.8  ALBUMIN 2.5*   No results for input(s): LIPASE, AMYLASE in the last 168 hours. No results for input(s): AMMONIA in the last 168 hours.  ABG    Component Value Date/Time   PHART 7.132 (LL) Jan 20, 2020 1045   PCO2ART 53.7 (H) January 20, 2020 1045   PO2ART 443.0 (H) 01/20/2020 1045   HCO3 18.1 (L) 01-20-20 1045   TCO2 20 (L) Jan 20, 2020 1045   ACIDBASEDEF 12.0 (H) 01/20/2020 1045   O2SAT 100.0 01-20-20 1045     Coagulation Profile: No results for input(s): INR, PROTIME in the last 168 hours.  Cardiac Enzymes: No results for input(s): CKTOTAL, CKMB, CKMBINDEX, TROPONINI in the last 168 hours.  HbA1C: Hemoglobin A1C  Date/Time Value Ref Range Status  11/22/2018 03:17 PM 5.4 4.0 - 5.6 % Final   HbA1c, POC (prediabetic range)  Date/Time Value Ref Range Status  10/20/2018 03:16 PM 5.8 5.7 - 6.4 % Final    CBG: No results for input(s): GLUCAP in the last 168 hours.  Review of Systems:  Unable to obtain  Past Medical History  She,  has a past medical history of Diabetes mellitus without complication (HCC).   Surgical History    Past Surgical History:  Procedure Laterality Date  . LUMBAR LAMINECTOMY/DECOMPRESSION MICRODISCECTOMY Right 01/17/2019   Procedure: RIGHT LUMBAR FOUR-FIVE MICRODISCECTOMY;  Surgeon: Julio Sicks, MD;  Location: [facility] OR;  Service: Neurosurgery;  Laterality: Right;  . NO PAST SURGERIES       Social History   reports that she has been smoking cigarettes. She has never used smokeless tobacco. She reports that she does not drink alcohol or use drugs.   Family History   Her family history includes Diabetes in her mother; Healthy in her father.    Allergies No Known Allergies   Home Medications  Prior to Admission medications   Medication Sig Start Date End Date Taking? Authorizing Provider  baclofen (LIORESAL) 10 MG tablet Take 10 mg by mouth 3 (three) times daily as needed for muscle spasms. 11/15/19   [provider]  cyclobenzaprine (FLEXERIL) 10 MG tablet Take 1 tablet (10 mg total) by mouth 3 (three) times daily as needed for muscle spasms. 01/20/19   Julio Sicks, MD  DULoxetine (CYMBALTA) 30 MG capsule Take 30-60 mg by mouth daily. 11/15/19   [provider]  ELIQUIS 5 MG TABS tablet Take 5 mg by mouth 2 (two) times daily. 11/03/19   [provider]  FERREX 150 150 MG capsule Take 150 mg by mouth daily. 11/03/19   [provider]  gabapentin (NEURONTIN) 300 MG capsule Take 600 mg by mouth in the morning, at noon, and at bedtime. 12/05/19   [provider]  HYDROcodone-acetaminophen (NORCO/VICODIN) 5-325 MG tablet Take 1 tablet by mouth every 4 (four) hours as needed for moderate pain ((score 4 to 6)). 01/20/19   Julio Sicks, MD  metFORMIN (GLUCOPHAGE) 500 MG tablet Take 1 tablet (500 mg total) by mouth 2 (two) times daily with a meal. 10/20/18   McClung, Marzella Schlein, PA-C  nortriptyline (PAMELOR) 25 MG capsule Take 25-75 mg by mouth at bedtime as needed for sleep. 11/15/19   [provider]  oxybutynin (DITROPAN-XL) 10 MG 24 hr tablet Take 10 mg by mouth daily. 11/09/19   [provider]  oxyCODONE-acetaminophen (PERCOCET) 10-325 MG tablet Take 1-2 tablets by mouth every 4 (four) hours as needed for severe pain. 11/15/19   [provider]  pravastatin (PRAVACHOL) 40 MG tablet Take 40 mg by mouth daily. 12/02/19   [provider]  propranolol (INDERAL) 10 MG tablet Take 10 mg by mouth 2 (two) times daily. 12/05/19   [provider]  rosuvastatin (CRESTOR) 10 MG tablet Take 1 tablet (10 mg total) by mouth daily. To lower cholesterol Patient not taking:  Reported on 01/17/2019 12/09/18   Cain Saupe, MD     Critical care time: 45 min     Levy Pupa, MD, PhD 01/08/2020, 11:55 AM [facility] [phone] or if no answer [phone]

## 2020-01-02 NOTE — Progress Notes (Signed)
RT NOTES: Transported patient from ED to room 2M02 on vent without complications.

## 2020-01-02 NOTE — Progress Notes (Addendum)
Pharmacy Antibiotic Note  Danielle Moreno is a 44 y.o. female admitted on 2020/01/14 for unresponsiveness. Patient has been intubated in the ED. Pharmacy has been consulted for vancomycin and cefepime dosing for possible infection of unknown source.  Vancomycin 2,000 mg IV x 1, cefepime 2 g IV x 1, and metronidazole 500 mg IV x 1 ordered to be given in the ED.  Plan: - Vancomycin 1250 mg IV Q 48 hrs. Goal AUC 400-550. Expected AUC: 506. SCr used: 3.37 - Cefepime 2 g IV q12h - Metronidazole per MD Follow renal function, cultures, de-escalation, and vancomycin levels as indicated  Height: 5\' 8"  (172.7 cm) Weight: 248 lb 14.4 oz (112.9 kg) IBW/kg (Calculated) : 63.9  No data recorded.  Recent Labs  Lab January 14, 2020 0921 January 14, 2020 0931  WBC 30.8*  --   CREATININE 3.37* 2.80*    Estimated Creatinine Clearance: 33.1 mL/min (A) (by C-G formula based on SCr of 2.8 mg/dL (H)).    No Known Allergies  Antimicrobials this admission: Vancomycin 2/15 >> Cefepime 2/15 >> Metronidazole 2/15 >>  Dose adjustments this admission: None  Microbiology results: 2/15 BCx: sent 2/15 COVID/flu: sent  Thank you for allowing pharmacy to be a part of this patient's care.  3/15, PharmD, [facility] PGY2 Cardiology Pharmacy Resident Phone [phone] 01-14-20       10:40 AM  Please check [email or web address] for unit-specific pharmacist phone numbers

## 2020-01-02 NOTE — Progress Notes (Signed)
RT  NOTES: Respiratory rate increased to 26 per CCM.

## 2020-01-02 NOTE — ED Notes (Signed)
Help get patient on the monitor did ekg shown to Dr Fredderick Phenix patient is resting with nurse at bedside

## 2020-01-02 NOTE — Progress Notes (Signed)
RT NOTES: Critical  ABG results given to Dr Fredderick Phenix. No changes ordered at this time.

## 2020-01-02 NOTE — Progress Notes (Signed)
PCCM Interval Progress Note  Called by RN for fever to 102.  Tylenol per tube ordered. Pan culture. Empiric vanc / zosyn for now.   Rutherford Guys, [place] [facility] 01-25-2020, 4:05 PM

## 2020-01-02 NOTE — ED Triage Notes (Signed)
Pt here from home via [facility] after husband could not wake her this am.  He stated she had been very thirsty the last few days and had been urinating a lot.  Pt arrived on nrb with sats of 96, snoring rr, unresponsive to sternal rub.

## 2020-01-02 NOTE — Procedures (Signed)
Central Venous Catheter Insertion Procedure Note SYNIA DOUGLASS [id number] 1976/11/07  Procedure: Insertion of Central Venous Catheter Indications: Assessment of intravascular volume, Drug and/or fluid administration and Frequent blood sampling  Procedure Details Consent: Risks of procedure as well as the alternatives and risks of each were explained to the (patient/caregiver).  Consent for procedure obtained. Time Out: Verified patient identification, verified procedure, site/side was marked, verified correct patient position, special equipment/implants available, medications/allergies/relevent history reviewed, required imaging and test results available.  Performed  Maximum sterile technique was used including antiseptics, cap, gloves, gown, hand hygiene, mask and sheet. Skin prep: Chlorhexidine; local anesthetic administered A antimicrobial bonded/coated triple lumen catheter was placed in the right internal jugular vein using the Seldinger technique.  Evaluation Blood flow good Complications: No apparent complications Patient did tolerate procedure well. Chest X-ray ordered to verify placement.  CXR: normal.  Delfin Gant, NP-C [facility] Contact / Pager information can be found on Amion  29-Jan-2020, 3:34 PM

## 2020-01-02 NOTE — ED Notes (Signed)
Checked patient cbg it read high notified the RN Steward Drone of blood sugar

## 2020-01-02 NOTE — ED Provider Notes (Addendum)
[facility] EMERGENCY DEPARTMENT Provider Note   CSN: [id number] Arrival date & time: 23-Jan-2020  0915     History No chief complaint on file.   Danielle Moreno is a 44 y.o. female.  Patient is a 44 year old female initially with no known past medical history although once chart review was performed, it was noted that she has a history of diabetes although none of her medications listed show any diabetic medications.  She also has a history of cauda equina related to her herniated disc status post surgical decompression in May 2020.  She reportedly has maintained an indwelling Foley catheter due to loss of bladder function.  Per EMS, she was last known normal yesterday when she went to bed and her husband noted that she was unresponsive this morning.  She was noted to be tachycardic with snoring respirations and had an elevated blood sugar in the 500 range by EMS.  She also is noted to have some ST elevations in V1 and V2.  She was placed on nonrebreather oxygen mask and started on IV fluids.  She had been given about 300 cc of normal saline prior to arrival.  Other history is unobtainable due to the patient status.        Past Medical History:  Diagnosis Date  . Diabetes mellitus without complication [facility])     Patient Active Problem List   Diagnosis Date Noted  . 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 Cauda equina syndrome (HCC) 01/17/2019  . Type 2 diabetes mellitus without complication, without long-term current use of insulin (HCC) 11/22/2018  . Lumbar spondylosis 11/22/2018    Past Surgical History:  Procedure Laterality Date  . LUMBAR LAMINECTOMY/DECOMPRESSION MICRODISCECTOMY Right 01/17/2019   Procedure: RIGHT LUMBAR FOUR-FIVE MICRODISCECTOMY;  Surgeon: Julio Sicks, MD;  Location: [facility] OR;  Service: Neurosurgery;  Laterality: Right;  . NO PAST SURGERIES       OB History   No obstetric history on file.     Family History  Problem Relation  Age of Onset  . Diabetes Mother   . Healthy Father     Social History   Tobacco Use  . Smoking status: Current Every Day Smoker    Types: Cigarettes  . Smokeless tobacco: Never Used  Substance Use Topics  . Alcohol use: No  . Drug use: Never    Home Medications Prior to Admission medications   Medication Sig Start Date End Date Taking? Authorizing Provider  cyclobenzaprine (FLEXERIL) 10 MG tablet Take 1 tablet (10 mg total) by mouth 3 (three) times daily as needed for muscle spasms. 01/20/19   Julio Sicks, MD  HYDROcodone-acetaminophen (NORCO/VICODIN) 5-325 MG tablet Take 1 tablet by mouth every 4 (four) hours as needed for moderate pain ((score 4 to 6)). 01/20/19   Julio Sicks, MD  metFORMIN (GLUCOPHAGE) 500 MG tablet Take 1 tablet (500 mg total) by mouth 2 (two) times daily with a meal. 10/20/18   McClung, Marzella Schlein, PA-C  rosuvastatin (CRESTOR) 10 MG tablet Take 1 tablet (10 mg total) by mouth daily. To lower cholesterol Patient not taking: Reported on 01/17/2019 12/09/18   Cain Saupe, MD    Allergies    Patient has no known allergies.  Review of Systems   Review of Systems  Unable to perform ROS: Patient unresponsive    Physical Exam Updated Vital Signs BP (!) 84/54   Pulse (!) 126   Resp (!) 21   Ht 5\' 8"  (1.727 m)   Wt 112.9  kg   SpO2 98%   BMI 37.85 kg/m   Physical Exam Constitutional:      Appearance: She is well-developed.     Comments: unresponsive  HENT:     Head: Normocephalic and atraumatic.  Eyes:     Pupils: Pupils are equal, round, and reactive to light.     Comments: Midpoint, symmetric  Cardiovascular:     Rate and Rhythm: Regular rhythm. Tachycardia present.     Heart sounds: Normal heart sounds.  Pulmonary:     Effort: Pulmonary effort is normal. Tachypnea present. No respiratory distress.     Breath sounds: Normal breath sounds. No wheezing or rales.     Comments: Snoring respirations Chest:     Chest wall: No tenderness.  Abdominal:      General: Bowel sounds are normal.     Palpations: Abdomen is soft.     Tenderness: There is no abdominal tenderness. There is no guarding or rebound.  Musculoskeletal:        General: Normal range of motion.     Cervical back: Normal range of motion and neck supple.     Comments: Cool extremities, no visible sacral decubitus  Lymphadenopathy:     Cervical: No cervical adenopathy.  Skin:    General: Skin is warm and dry.     Findings: No rash.  Neurological:     Comments: Unresponsive     ED Results / Procedures / Treatments   Labs (all labs ordered are listed, but only abnormal results are displayed) Labs Reviewed  BASIC METABOLIC PANEL - Abnormal; Notable for the following components:      Result Value   Sodium 129 (*)    Potassium 5.8 (*)    Chloride 88 (*)    CO2 13 (*)    BUN 33 (*)    Creatinine, Ser 3.37 (*)    GFR calc non Af Amer 16 (*)    GFR calc Af Amer 18 (*)    Anion gap 28 (*)    All other components within normal limits  CBC - Abnormal; Notable for the following components:   WBC 30.8 (*)    RBC 5.24 (*)    Hemoglobin 15.2 (*)    HCT 54.3 (*)    MCV 103.6 (*)    MCHC 28.0 (*)    Platelets 489 (*)    All other components within normal limits  APTT - Abnormal; Notable for the following components:   aPTT 23 (*)    All other components within normal limits  HEPATIC FUNCTION PANEL - Abnormal; Notable for the following components:   Albumin 2.5 (*)    Alkaline Phosphatase 141 (*)    Total Bilirubin 1.3 (*)    Bilirubin, Direct 0.6 (*)    All other components within normal limits  I-STAT CHEM 8, ED - Abnormal; Notable for the following components:   Sodium 130 (*)    Potassium 6.0 (*)    BUN 51 (*)    Creatinine, Ser 2.80 (*)    Glucose, Bld >700 (*)    Calcium, Ion 1.12 (*)    TCO2 16 (*)    Hemoglobin 17.7 (*)    HCT 52.0 (*)    All other components within normal limits  TROPONIN I (HIGH SENSITIVITY) - Abnormal; Notable for the following  components:   Troponin I (High Sensitivity) 88 (*)    All other components within normal limits  CULTURE, BLOOD (ROUTINE X 2)  CULTURE, BLOOD (ROUTINE X  2)  RESPIRATORY PANEL BY RT PCR (FLU A&B, COVID)  URINALYSIS, ROUTINE W REFLEX MICROSCOPIC  LACTIC ACID, PLASMA  OSMOLALITY  BLOOD GAS, ARTERIAL  BLOOD GAS, ARTERIAL  I-STAT BETA HCG BLOOD, ED ([facility], [facility], [facility] ONLY)  CBG MONITORING, ED  CBG MONITORING, ED    EKG EKG Interpretation  Date/Time:  Monday 01/13/2020 09:18:33 EST Ventricular Rate:  133 PR Interval:    QRS Duration: 96 QT Interval:  344 QTC Calculation: 512 R Axis:   -47 Text Interpretation: Sinus tachycardia Left atrial enlargement Left anterior fascicular block Anterior infarct, acute (LAD) ST elevation, consider inferior injury Lateral leads are also involved Prolonged QT interval Baseline wander in lead(s) V5 >>> Acute MI <<< Confirmed by Rolan Bucco [phone]) on January 13, 2020 9:33:28 AM   Radiology CT Head Wo Contrast  Result Date: 01-13-20 CLINICAL DATA:  Neuro deficits EXAM: CT HEAD WITHOUT CONTRAST TECHNIQUE: Contiguous axial images were obtained from the base of the skull through the vertex without intravenous contrast. COMPARISON:  None. FINDINGS: Brain: No evidence of acute infarction, hemorrhage, hydrocephalus, extra-axial collection or mass lesion/mass effect. Vascular: No hyperdense vessel or unexpected calcification. Skull: Normal. Negative for fracture or focal lesion. Sinuses/Orbits: No acute finding. Other: None. IMPRESSION: Normal head CT for age Electronically Signed   By: Alcide Clever M.D.   On: 13-Jan-2020 10:31   DG Chest Portable 1 View  Result Date: 13-Jan-2020 CLINICAL DATA:  Post intubation. Additional history provided: Unresponsive; post intubation. EXAM: PORTABLE CHEST 1 VIEW COMPARISON:  CTA chest 01/25/2019 FINDINGS: ET tube terminates 3.1 cm above the level of the carina. An enteric tube passes below level of left hemidiaphragm with tip  excluded from the field of view. Overlying cardiac monitoring leads. Heart size within normal limits. Shallow inspiration radiograph with bibasilar atelectasis. No evidence of pleural effusion or pneumothorax. No acute bony abnormality. IMPRESSION: Support apparatus as described. ET tube terminates 3.1 cm above the level of the carina. Shallow inspiration radiograph with bibasilar atelectasis. Electronically Signed   By: Jackey Loge DO   On: 2020/01/13 09:51    Procedures Procedure Name: Intubation Date/Time: Jan 13, 2020 4:03 PM Performed by: Rolan Bucco, MD Pre-anesthesia Checklist: Patient identified, Patient being monitored, Emergency Drugs available, Timeout performed and Suction available Oxygen Delivery Method: Non-rebreather mask Preoxygenation: Pre-oxygenation with 100% oxygen Induction Type: Rapid sequence Laryngoscope Size: Glidescope and 3 Grade View: Grade I Tube type: Subglottic suction tube Tube size: 7.5 mm Number of attempts: 1 Placement Confirmation: ETT inserted through vocal cords under direct vision,  CO2 detector and Breath sounds checked- equal and bilateral Tube secured with: ETT holder Dental Injury: Teeth and Oropharynx as per pre-operative assessment       (including critical care time)  Medications Ordered in ED Medications  sodium chloride flush (NS) 0.9 % injection 3 mL (has no administration in time range)  fentaNYL (SUBLIMAZE) injection 50 mcg (has no administration in time range)  fentaNYL in NS (68mcg/ml) infusion-PREMIX (has no administration in time range)  fentaNYL (SUBLIMAZE) bolus via infusion 50 mcg (has no administration in time range)  calcium gluconate 1 g in sodium chloride 0.9 % 100 mL IVPB (1 g Intravenous New Bag/Given 01/13/2020 1013)  insulin regular, human (MYXREDLIN) 100 units/ 100 mL infusion (has no administration in time range)  0.9 %  sodium chloride infusion (has no administration in time range)  dextrose 5 %-0.45 %  sodium chloride infusion (has no administration in time range)  dextrose 50 % solution 0-50 mL (has  no administration in time range)  sodium chloride 0.9 % bolus 1,000 mL (has no administration in time range)  phenylephrine 0.4-0.9 MG/10ML-% injection (has no administration in time range)  ceFEPIme (MAXIPIME) 2 g in sodium chloride 0.9 % 100 mL IVPB (has no administration in time range)  metroNIDAZOLE (FLAGYL) IVPB 500 mg (has no administration in time range)  vancomycin (VANCOREADY) IVPB 2000 mg/400 mL (has no administration in time range)  ceFEPIme (MAXIPIME) 2 g in sodium chloride 0.9 % 100 mL IVPB (has no administration in time range)  vancomycin (VANCOREADY) IVPB 750 mg/150 mL (has no administration in time range)  calcium gluconate 1 g in sodium chloride 0.9 % 100 mL IVPB (has no administration in time range)  sodium bicarbonate injection 50 mEq (has no administration in time range)  insulin aspart (novoLOG) injection 10 Units (has no administration in time range)  lactated ringers bolus 3,000 mL (has no administration in time range)  etomidate (AMIDATE) injection (20 mg Intravenous Given 01/06/2020 0925)  rocuronium (ZEMURON) injection (80 mg Intravenous Given 12/21/2019 0925)  sodium bicarbonate injection 50 mEq (50 mEq Intravenous Given 01/06/2020 0954)    ED Course  I have reviewed the triage vital signs and the nursing notes.  Pertinent labs & imaging results that were available during my care of the patient were reviewed by me and considered in my medical decision making (see chart for details).    MDM Rules/Calculators/A&P                     Patient is a 44 year old female who presents in unresponsive state.  She initially had a blood pressure of 160 systolic.  She was intubated for airway protection.  About 20 minutes following the intubation, she did drop her blood pressure into the 60s.  She had already received about 750 cc of IV fluid.  IV fluid was continued and she was given a  dose of push dose phenylephrine.  This improved her blood pressure into the 80s.  She was continued with IV fluids.  Her blood sugar was noted to return at greater than 700 on i-STAT.  Her potassium was greater than 6.  She was given calcium and sodium bicarb.  She was also started on the Endo tool for glucose control.  Initially it was not known that she was diabetic.  After talking to her husband, he states that she has been sick for a few days with nausea and vomiting and increased thirst.  Reportedly she has been off her diabetes medications for some time but he does not know exactly how long.  She is on Eliquis, reportedly for prior DVT after her spine surgery last year.  She had a head CT that shows no acute abnormality.  Her chest x-ray does not show any infiltrate or other acute abnormality.  She had volatile blood pressures at times dipping down again into the 60s and 70s.  However they do seem to be responding to IV fluids and she has had 2 doses of push dose phenylephrine.  Was started on IV antibiotics for potential sepsis.  She did have some ST changes on her initial EKG in V1 and V2.  However repeat EKG showed that these changes had resolved following resuscitation and improvement of her heart rate.  I spoke to the intensivist who will see the patient for admission.  CRITICAL CARE Performed by: Malvin Johns Total critical care time: 75 minutes Critical care time was exclusive of separately billable procedures  and treating other patients. Critical care was necessary to treat or prevent imminent or life-threatening deterioration. Critical care was time spent personally by me on the following activities: development of treatment plan with patient and/or surrogate as well as nursing, discussions with consultants, evaluation of patient's response to treatment, examination of patient, obtaining history from patient or surrogate, ordering and performing treatments and interventions, ordering and  review of laboratory studies, ordering and review of radiographic studies, pulse oximetry and re-evaluation of patient's condition.     Final Clinical Impression(s) / ED Diagnoses Final diagnoses:  Diabetic ketoacidosis with coma associated with type 2 diabetes mellitus (HCC)  Hyperkalemia  Hypotension, unspecified hypotension type    Rx / DC Orders ED Discharge Orders    None       Rolan Bucco, MD 12/28/2019 1215    Rolan Bucco, MD 12/21/2019 1603

## 2020-01-03 ENCOUNTER — Inpatient Hospital Stay (HOSPITAL_COMMUNITY): Payer: 59

## 2020-01-03 DIAGNOSIS — A419 Sepsis, unspecified organism: Principal | ICD-10-CM

## 2020-01-03 DIAGNOSIS — R6521 Severe sepsis with septic shock: Secondary | ICD-10-CM

## 2020-01-03 DIAGNOSIS — J9600 Acute respiratory failure, unspecified whether with hypoxia or hypercapnia: Secondary | ICD-10-CM

## 2020-01-03 DIAGNOSIS — N170 Acute kidney failure with tubular necrosis: Secondary | ICD-10-CM

## 2020-01-03 DIAGNOSIS — J9601 Acute respiratory failure with hypoxia: Secondary | ICD-10-CM

## 2020-01-03 DIAGNOSIS — E1111 Type 2 diabetes mellitus with ketoacidosis with coma: Secondary | ICD-10-CM

## 2020-01-03 LAB — BLOOD GAS, ARTERIAL
Acid-base deficit: 14.4 mmol/L — ABNORMAL HIGH (ref 0.0–2.0)
Bicarbonate: 11.6 mmol/L — ABNORMAL LOW (ref 20.0–28.0)
FIO2: 40
O2 Saturation: 95.6 %
Patient temperature: 37.7
pCO2 arterial: 29.1 mmHg — ABNORMAL LOW (ref 32.0–48.0)
pH, Arterial: 7.23 — ABNORMAL LOW (ref 7.350–7.450)
pO2, Arterial: 90.1 mmHg (ref 83.0–108.0)

## 2020-01-03 LAB — URINALYSIS, ROUTINE W REFLEX MICROSCOPIC
Bilirubin Urine: NEGATIVE
Glucose, UA: >=500 mg/dL — AB
Ketones, ur: NEGATIVE mg/dL
Nitrite: NEGATIVE
Protein, ur: 100 mg/dL — AB
Specific Gravity, Urine: 1.02 (ref 1.005–1.030)
WBC, UA: >50 WBC/hpf — ABNORMAL HIGH (ref 0–5)
pH: 5 (ref 5.0–8.0)

## 2020-01-03 LAB — POCT I-STAT 7, (LYTES, BLD GAS, ICA,H+H)
Acid-base deficit: 14 mmol/L — ABNORMAL HIGH (ref 0.0–2.0)
Acid-base deficit: 16 mmol/L — ABNORMAL HIGH (ref 0.0–2.0)
Acid-base deficit: 14 mmol/L — ABNORMAL HIGH (ref 0.0–2.0)
Bicarbonate: 11.9 mmol/L — ABNORMAL LOW (ref 20.0–28.0)
Bicarbonate: 11.5 mmol/L — ABNORMAL LOW (ref 20.0–28.0)
Bicarbonate: 12.8 mmol/L — ABNORMAL LOW (ref 20.0–28.0)
Calcium, Ion: 1.13 mmol/L — ABNORMAL LOW (ref 1.15–1.40)
Calcium, Ion: 0.96 mmol/L — ABNORMAL LOW (ref 1.15–1.40)
Calcium, Ion: 0.92 mmol/L — ABNORMAL LOW (ref 1.15–1.40)
HCT: 39 % (ref 36.0–46.0)
HCT: 30 % — ABNORMAL LOW (ref 36.0–46.0)
HCT: 29 % — ABNORMAL LOW (ref 36.0–46.0)
Hemoglobin: 13.3 g/dL (ref 12.0–15.0)
Hemoglobin: 10.2 g/dL — ABNORMAL LOW (ref 12.0–15.0)
Hemoglobin: 9.9 g/dL — ABNORMAL LOW (ref 12.0–15.0)
O2 Saturation: 97 %
O2 Saturation: 88 %
O2 Saturation: 89 %
Patient temperature: 100.1
Patient temperature: 99.4
Patient temperature: 98.9
Potassium: 4.4 mmol/L (ref 3.5–5.1)
Potassium: 4.4 mmol/L (ref 3.5–5.1)
Potassium: 4.6 mmol/L (ref 3.5–5.1)
Sodium: 147 mmol/L — ABNORMAL HIGH (ref 135–145)
Sodium: 147 mmol/L — ABNORMAL HIGH (ref 135–145)
Sodium: 147 mmol/L — ABNORMAL HIGH (ref 135–145)
TCO2: 13 mmol/L — ABNORMAL LOW (ref 22–32)
TCO2: 12 mmol/L — ABNORMAL LOW (ref 22–32)
TCO2: 14 mmol/L — ABNORMAL LOW (ref 22–32)
pCO2 arterial: 27.7 mmHg — ABNORMAL LOW (ref 32.0–48.0)
pCO2 arterial: 32.1 mmHg (ref 32.0–48.0)
pCO2 arterial: 34 mmHg (ref 32.0–48.0)
pH, Arterial: 7.245 — ABNORMAL LOW (ref 7.350–7.450)
pH, Arterial: 7.166 — CL (ref 7.350–7.450)
pH, Arterial: 7.184 — CL (ref 7.350–7.450)
pO2, Arterial: 109 mmHg — ABNORMAL HIGH (ref 83.0–108.0)
pO2, Arterial: 70 mmHg — ABNORMAL LOW (ref 83.0–108.0)
pO2, Arterial: 71 mmHg — ABNORMAL LOW (ref 83.0–108.0)

## 2020-01-03 LAB — GLUCOSE, CAPILLARY
Glucose-Capillary: 116 mg/dL — ABNORMAL HIGH (ref 70–99)
Glucose-Capillary: 124 mg/dL — ABNORMAL HIGH (ref 70–99)
Glucose-Capillary: 134 mg/dL — ABNORMAL HIGH (ref 70–99)
Glucose-Capillary: 139 mg/dL — ABNORMAL HIGH (ref 70–99)
Glucose-Capillary: 146 mg/dL — ABNORMAL HIGH (ref 70–99)
Glucose-Capillary: 152 mg/dL — ABNORMAL HIGH (ref 70–99)
Glucose-Capillary: 160 mg/dL — ABNORMAL HIGH (ref 70–99)
Glucose-Capillary: 166 mg/dL — ABNORMAL HIGH (ref 70–99)
Glucose-Capillary: 183 mg/dL — ABNORMAL HIGH (ref 70–99)
Glucose-Capillary: 195 mg/dL — ABNORMAL HIGH (ref 70–99)
Glucose-Capillary: 200 mg/dL — ABNORMAL HIGH (ref 70–99)
Glucose-Capillary: 227 mg/dL — ABNORMAL HIGH (ref 70–99)
Glucose-Capillary: 229 mg/dL — ABNORMAL HIGH (ref 70–99)
Glucose-Capillary: 235 mg/dL — ABNORMAL HIGH (ref 70–99)
Glucose-Capillary: 240 mg/dL — ABNORMAL HIGH (ref 70–99)
Glucose-Capillary: 247 mg/dL — ABNORMAL HIGH (ref 70–99)
Glucose-Capillary: 248 mg/dL — ABNORMAL HIGH (ref 70–99)
Glucose-Capillary: 277 mg/dL — ABNORMAL HIGH (ref 70–99)
Glucose-Capillary: 331 mg/dL — ABNORMAL HIGH (ref 70–99)
Glucose-Capillary: 348 mg/dL — ABNORMAL HIGH (ref 70–99)
Glucose-Capillary: 362 mg/dL — ABNORMAL HIGH (ref 70–99)
Glucose-Capillary: 386 mg/dL — ABNORMAL HIGH (ref 70–99)
Glucose-Capillary: 396 mg/dL — ABNORMAL HIGH (ref 70–99)
Glucose-Capillary: 412 mg/dL — ABNORMAL HIGH (ref 70–99)
Glucose-Capillary: 435 mg/dL — ABNORMAL HIGH (ref 70–99)
Glucose-Capillary: 437 mg/dL — ABNORMAL HIGH (ref 70–99)
Glucose-Capillary: 476 mg/dL — ABNORMAL HIGH (ref 70–99)
Glucose-Capillary: 498 mg/dL — ABNORMAL HIGH (ref 70–99)
Glucose-Capillary: >600 mg/dL (ref 70–99)
Glucose-Capillary: >600 mg/dL (ref 70–99)

## 2020-01-03 LAB — RENAL FUNCTION PANEL
Albumin: 1.9 g/dL — ABNORMAL LOW (ref 3.5–5.0)
Anion gap: 21 — ABNORMAL HIGH (ref 5–15)
BUN: 33 mg/dL — ABNORMAL HIGH (ref 6–20)
CO2: 12 mmol/L — ABNORMAL LOW (ref 22–32)
Calcium: 7 mg/dL — ABNORMAL LOW (ref 8.9–10.3)
Chloride: 113 mmol/L — ABNORMAL HIGH (ref 98–111)
Creatinine, Ser: 4.77 mg/dL — ABNORMAL HIGH (ref 0.44–1.00)
GFR calc Af Amer: 12 mL/min — ABNORMAL LOW (ref >60)
GFR calc non Af Amer: 10 mL/min — ABNORMAL LOW (ref >60)
Glucose, Bld: 168 mg/dL — ABNORMAL HIGH (ref 70–99)
Phosphorus: 6.4 mg/dL — ABNORMAL HIGH (ref 2.5–4.6)
Potassium: 4.9 mmol/L (ref 3.5–5.1)
Sodium: 146 mmol/L — ABNORMAL HIGH (ref 135–145)

## 2020-01-03 LAB — BASIC METABOLIC PANEL
Anion gap: 23 — ABNORMAL HIGH (ref 5–15)
Anion gap: 19 — ABNORMAL HIGH (ref 5–15)
Anion gap: 19 — ABNORMAL HIGH (ref 5–15)
BUN: 31 mg/dL — ABNORMAL HIGH (ref 6–20)
BUN: 33 mg/dL — ABNORMAL HIGH (ref 6–20)
BUN: 33 mg/dL — ABNORMAL HIGH (ref 6–20)
CO2: 11 mmol/L — ABNORMAL LOW (ref 22–32)
CO2: 11 mmol/L — ABNORMAL LOW (ref 22–32)
CO2: 11 mmol/L — ABNORMAL LOW (ref 22–32)
Calcium: 8.1 mg/dL — ABNORMAL LOW (ref 8.9–10.3)
Calcium: 7.6 mg/dL — ABNORMAL LOW (ref 8.9–10.3)
Calcium: 7.2 mg/dL — ABNORMAL LOW (ref 8.9–10.3)
Chloride: 113 mmol/L — ABNORMAL HIGH (ref 98–111)
Chloride: 116 mmol/L — ABNORMAL HIGH (ref 98–111)
Chloride: 115 mmol/L — ABNORMAL HIGH (ref 98–111)
Creatinine, Ser: 4.3 mg/dL — ABNORMAL HIGH (ref 0.44–1.00)
Creatinine, Ser: 4.35 mg/dL — ABNORMAL HIGH (ref 0.44–1.00)
Creatinine, Ser: 4.57 mg/dL — ABNORMAL HIGH (ref 0.44–1.00)
GFR calc Af Amer: 13 mL/min — ABNORMAL LOW (ref >60)
GFR calc Af Amer: 13 mL/min — ABNORMAL LOW (ref >60)
GFR calc Af Amer: 12 mL/min — ABNORMAL LOW (ref >60)
GFR calc non Af Amer: 12 mL/min — ABNORMAL LOW (ref >60)
GFR calc non Af Amer: 11 mL/min — ABNORMAL LOW (ref >60)
GFR calc non Af Amer: 11 mL/min — ABNORMAL LOW (ref >60)
Glucose, Bld: 365 mg/dL — ABNORMAL HIGH (ref 70–99)
Glucose, Bld: 274 mg/dL — ABNORMAL HIGH (ref 70–99)
Glucose, Bld: 191 mg/dL — ABNORMAL HIGH (ref 70–99)
Potassium: 4.5 mmol/L (ref 3.5–5.1)
Potassium: 4.5 mmol/L (ref 3.5–5.1)
Potassium: 4.6 mmol/L (ref 3.5–5.1)
Sodium: 147 mmol/L — ABNORMAL HIGH (ref 135–145)
Sodium: 146 mmol/L — ABNORMAL HIGH (ref 135–145)
Sodium: 145 mmol/L (ref 135–145)

## 2020-01-03 LAB — PROCALCITONIN: Procalcitonin: 45.07 ng/mL

## 2020-01-03 LAB — CBC
HCT: 43.5 % (ref 36.0–46.0)
Hemoglobin: 13.4 g/dL (ref 12.0–15.0)
MCH: 29 pg (ref 26.0–34.0)
MCHC: 30.8 g/dL (ref 30.0–36.0)
MCV: 94.2 fL (ref 80.0–100.0)
Platelets: 137 10*3/uL — ABNORMAL LOW (ref 150–400)
RBC: 4.62 MIL/uL (ref 3.87–5.11)
RDW: 15 % (ref 11.5–15.5)
WBC: 38.2 10*3/uL — ABNORMAL HIGH (ref 4.0–10.5)
nRBC: 0.2 % (ref 0.0–0.2)

## 2020-01-03 LAB — RAPID URINE DRUG SCREEN, HOSP PERFORMED
Amphetamines: NOT DETECTED
Barbiturates: NOT DETECTED
Benzodiazepines: NOT DETECTED
Cocaine: NOT DETECTED
Opiates: NOT DETECTED
Tetrahydrocannabinol: NOT DETECTED

## 2020-01-03 LAB — PHOSPHORUS: Phosphorus: 4.9 mg/dL — ABNORMAL HIGH (ref 2.5–4.6)

## 2020-01-03 LAB — SODIUM, URINE, RANDOM: Sodium, Ur: 71 mmol/L

## 2020-01-03 LAB — MAGNESIUM: Magnesium: 1.9 mg/dL (ref 1.7–2.4)

## 2020-01-03 MED ORDER — METRONIDAZOLE IN NACL 5-0.79 MG/ML-% IV SOLN
500.0000 mg | Freq: Three times a day (TID) | INTRAVENOUS | Status: DC
  Administered 2020-01-03 – 2020-01-04 (×3): 500 mg via INTRAVENOUS
  Filled 2020-01-03 (×3): qty 100

## 2020-01-03 MED ORDER — SODIUM CHLORIDE 0.9 % IV SOLN
2.0000 g | INTRAVENOUS | Status: DC
  Administered 2020-01-03: 2 g via INTRAVENOUS
  Filled 2020-01-03: qty 2

## 2020-01-03 MED ORDER — SODIUM CHLORIDE 0.45 % IV SOLN: Freq: Once | INTRAVENOUS | Status: AC

## 2020-01-03 MED ORDER — SODIUM BICARBONATE 8.4 % IV SOLN
50.0000 meq | Freq: Once | INTRAVENOUS | Status: AC
  Administered 2020-01-03: 20:00:00 50 meq via INTRAVENOUS
  Filled 2020-01-03: qty 50

## 2020-01-03 MED ORDER — ALBUMIN HUMAN 25 % IV SOLN
25.0000 g | Freq: Once | INTRAVENOUS | Status: AC
  Administered 2020-01-03: 25 g via INTRAVENOUS
  Filled 2020-01-03: qty 100

## 2020-01-03 MED ORDER — FUROSEMIDE 10 MG/ML IJ SOLN: 40.0000 mg | Freq: Once | INTRAMUSCULAR | Status: DC

## 2020-01-03 MED ORDER — STERILE WATER FOR INJECTION IV SOLN
INTRAVENOUS | Status: DC
  Filled 2020-01-03 (×4): qty 850

## 2020-01-03 MED ORDER — CALCIUM GLUCONATE-NACL 1-0.675 GM/50ML-% IV SOLN
1.0000 g | Freq: Once | INTRAVENOUS | Status: AC
  Administered 2020-01-03: 1000 mg via INTRAVENOUS
  Filled 2020-01-03: qty 50

## 2020-01-03 MED ORDER — NOREPINEPHRINE 16 MG/250ML-% IV SOLN
0.0000 ug/min | INTRAVENOUS | Status: DC
  Administered 2020-01-03: 20:00:00 38 ug/min via INTRAVENOUS
  Administered 2020-01-04 (×2): 40 ug/min via INTRAVENOUS
  Filled 2020-01-03 (×3): qty 250

## 2020-01-03 MED ORDER — HEPARIN SODIUM (PORCINE) 1000 UNIT/ML DIALYSIS
1000.0000 [IU] | INTRAMUSCULAR | Status: DC | PRN
  Administered 2020-01-03: 3000 [IU] via INTRAVENOUS_CENTRAL
  Filled 2020-01-03 (×2): qty 6
  Filled 2020-01-03: qty 5

## 2020-01-03 MED ORDER — PRISMASOL BGK 4/2.5 32-4-2.5 MEQ/L REPLACEMENT SOLN
Status: DC
  Filled 2020-01-03 (×2): qty 5000

## 2020-01-03 MED ORDER — SODIUM BICARBONATE 8.4 % IV SOLN
INTRAVENOUS | Status: AC
  Administered 2020-01-04: 50 meq via INTRAVENOUS
  Filled 2020-01-03: qty 50

## 2020-01-03 MED ORDER — PRISMASOL BGK 4/2.5 32-4-2.5 MEQ/L IV SOLN
INTRAVENOUS | Status: DC
  Filled 2020-01-03 (×15): qty 5000

## 2020-01-03 MED ORDER — APIXABAN 5 MG PO TABS
5.0000 mg | ORAL_TABLET | Freq: Two times a day (BID) | ORAL | Status: DC
  Administered 2020-01-03 – 2020-01-04 (×2): 5 mg
  Filled 2020-01-03: qty 1

## 2020-01-03 MED ORDER — FAMOTIDINE 40 MG/5ML PO SUSR
20.0000 mg | Freq: Every day | ORAL | Status: DC
  Administered 2020-01-04: 20 mg
  Filled 2020-01-03: qty 2.5

## 2020-01-03 MED ORDER — CHLORHEXIDINE GLUCONATE CLOTH 2 % EX PADS: 6.0000 | MEDICATED_PAD | Freq: Every day | CUTANEOUS | Status: DC

## 2020-01-03 MED ORDER — LACTATED RINGERS IV BOLUS
500.0000 mL | Freq: Once | INTRAVENOUS | Status: AC
  Administered 2020-01-03: 500 mL via INTRAVENOUS

## 2020-01-03 MED ORDER — VANCOMYCIN VARIABLE DOSE PER UNSTABLE RENAL FUNCTION (PHARMACIST DOSING): Status: DC

## 2020-01-03 MED ORDER — PRISMASOL BGK 4/2.5 32-4-2.5 MEQ/L REPLACEMENT SOLN
Status: DC
  Filled 2020-01-03 (×3): qty 5000

## 2020-01-03 MED ORDER — PHENYLEPHRINE HCL-NACL 10-0.9 MG/250ML-% IV SOLN
0.0000 ug/min | INTRAVENOUS | Status: DC
  Administered 2020-01-03: 20 ug/min via INTRAVENOUS
  Administered 2020-01-03: 120 ug/min via INTRAVENOUS
  Administered 2020-01-03 (×2): 100 ug/min via INTRAVENOUS
  Administered 2020-01-04: 02:00:00 200 ug/min via INTRAVENOUS
  Administered 2020-01-04: 01:00:00 260 ug/min via INTRAVENOUS
  Filled 2020-01-03 (×7): qty 250

## 2020-01-03 NOTE — Progress Notes (Signed)
NAME:  Danielle Moreno, MRN:  [id number], DOB:  [birth year], LOS: 1 ADMISSION DATE:  12/27/2019,  REFERRING MD: Dr. Fredderick Phenix, ED,  CHIEF COMPLAINT: DKA, acute respiratory failure  Brief History   44 year old woman, cauda equina syndrome, chronic Foley catheter, remote DVT on Eliquis, diabetes.  Admitted with DKA, encephalopathy, intubated for airway protection.  History of present illness   44 year old woman with a history of cauda equina syndrome, Herniated disc disease post surgical decompression 03/2019 and for which she has a chronic Foley catheter, mild diabetes managed on metformin, hyperlipidemia.  Reportedly had a DVT after her spine surgery last year, on chronic Eliquis.  She has experienced 4 to 5 days of poor p.o. intake, extreme thirst nausea.  She was found by her husband to be unresponsive 2/15, snoring respirations.  EMS activated, found to be hyperglycemic.  In the ED unresponsive and ET intubated for airway protection. She developed hypotension post induction and intubation.  Lab evaluation consistent with DKA, uncontrolled diabetes, acute renal failure, severe anion gap metabolic acidosis, hyperkalemia.  No clear infectious prodrome or precipitant of her decompensation.  She received empiric antibiotics x1 in the emergency department.  Past Medical History   Past Medical History:  Diagnosis Date  . Cauda equina compression (HCC)   . Diabetes mellitus without complication (HCC)   . Indwelling urinary catheter fell out [facility])   Cauda equina syndrome, herniated disc disease status post surgery 03/2019 DVT in the setting of her surgery 03/2019, on Eliquis Bladder dysfunction due to her cauda equina syndrome, chronic Foley catheter Hyperlipidemia   Significant Hospital Events     Consults:    Procedures:  ETT 2/15 >>  CVC 2/15 >>   Significant Diagnostic Tests:  Head CT 2/15 >> no acute findings to explain encephalopathy Chest x-ray 2/15 >> no infiltrates, ET tube in good  position, bibasilar atelectasis  Micro Data:  Blood 2/15 >>  Respiratory 2/15 >> Urine 2/15 >>   Antimicrobials:  Vancomycin x1 2/15 Cefepime x1 2/15 Flagyl x1 2/15 Zosyn 2/15  Interim history/subjective:  Hypotension, on 2 pressors Aggressive fluid resuscitation unresponsive  Objective   Blood pressure 100/60, pulse (!) 106, temperature 98.8 F (37.1 C), temperature source Bladder, resp. rate (!) 28, height 5\' 8"  (1.727 m), weight 123 kg, SpO2 95 %. CVP:  [2 mmHg-11 mmHg] 10 mmHg  Vent Mode: PRVC FiO2 (%):  [40 %-100 %] 40 % Set Rate:  [18 bmp-26 bmp] 26 bmp Vt Set:  [510 mL] 510 mL PEEP:  [5 cmH20] 5 cmH20 Plateau Pressure:  [15 cmH20-22 cmH20] 15 cmH20   Intake/Output Summary (Last 24 hours) at 01/03/2020 0747 Last data filed at 01/03/2020 0700 Gross per 24 hour  Intake 11606.95 ml  Output 306 ml  Net 11300.95 ml   Filed Weights   12/29/2019 1034 01/03/20 0500  Weight: 112.9 kg 123 kg    Examination: General: Obese, intubated, ill-appearing HENT: ET tube in good position, pupils 3 mm, sluggish Lungs: Coarse breath sounds, decreased at the bases  cardiovascular: S1-S2 appreciated, tachycardic Abdomen: Obese, bowel sounds appreciated Extremities: Trace pretibial edema Neuro: Unresponsive GU: Not making much urine  Resolved Hospital Problem list     Assessment & Plan:   Acute respiratory failure require mechanical ventilation Encephalopathy Continue PRVC, 8cc/kg Chest x-ray unchanged with no new infiltrate VAP order set  DKA Continue insulin Fluid resuscitation Hold Metformin  Acute encephalopathy Toxic encephalopathy due to DKA RASS goal of -1 to -2 Fentanyl and Versed as needed  DKA, first occurrence Insulin drip running, IV fluids to transition to D5 half-normal saline to facilitate drip Aggressive IV fluid resuscitation Frequent BMP.  Follow ABG Metformin on hold Anion gap 23  Shock Sepsis Unclear source of infection at  present Continue antibiotic therapy-we will continue cefepime, vancomycin, Flagyl De-escalate antibiotics as cultures results become available  order procalcitonin  Acute renal failure due to ATN Will maintain aggressive volume resuscitation Trend electrolytes Avoid nephrotoxic's  Cauda equina syndrome Chronic Foley in place Foley replaced Hold medications that may contribute to sedation-baclofen, Flexeril, oxybutynin  History of DVT Eliquis on hold at present  History of depression Cymbalta per tube-hold at present  Consult wound service for groin Maceration   Best practice:  Diet: NPO Pain/Anxiety/Delirium protocol (if indicated): RASS -1 to -2, fentanyl, Versed as needed VAP protocol (if indicated): Ordered DVT prophylaxis: Heparin subcutaneous for GI prophylaxis: Pepcid Glucose control: Insulin infusion Mobility: BR Code Status: Full Family Communication: Will update Disposition: ICU  Labs   CBC: Recent Labs  Lab January 21, 2020 0921 01/21/2020 0931 2020/01/21 1045 January 21, 2020 1724 2020/01/21 2203 01/03/20 0220 01/03/20 0352  WBC 30.8*  --   --   --   --  38.2*  --   HGB 15.2*   < > 14.3 14.3 13.9 13.4 13.3  HCT 54.3*   < > 42.0 42.0 41.0 43.5 39.0  MCV 103.6*  --   --   --   --  94.2  --   PLT 489*  --   --   --   --  137*  --    < > = values in this interval not displayed.    Basic Metabolic Panel: Recent Labs  Lab 21-Jan-2020 0921 01/21/20 0921 01/21/20 0931 01-21-2020 1045 01-21-2020 1654 2020/01/21 1654 January 21, 2020 1724 2020-01-21 2130 Jan 21, 2020 2203 01/03/20 0220 01/03/20 0330 01/03/20 0352  NA 129*   < > 130*   < > 147*   < > 143 146* 148*  --  147* 147*  K 5.8*   < > 6.0*   < > 4.1   < > 4.2 4.2 3.9  --  4.5 4.4  CL 88*  --  98  --  107  --   --  114*  --   --  113*  --   CO2 13*  --   --   --  15*  --   --  12*  --   --  11*  --   GLUCOSE 1,719*  --  >700*  --  829*  --   --  538*  --   --  365*  --   BUN 33*  --  51*  --  35*  --   --  31*  --   --  31*  --    CREATININE 3.37*  --  2.80*  --  3.74*  --   --  3.96*  --   --  4.30*  --   CALCIUM 10.0  --   --   --  8.6*  --   --  8.4*  --   --  8.1*  --   MG  --   --   --   --   --   --   --   --   --  1.9  --   --   PHOS  --   --   --   --   --   --   --   --   --  4.9*  --   --    < > = values in this interval not displayed.   GFR: Estimated Creatinine Clearance: 22.6 mL/min (A) (by C-G formula based on SCr of 4.3 mg/dL (H)). Recent Labs  Lab 12/27/2019 0921 01/10/2020 1125 01/10/2020 2130 01/03/20 0220  WBC 30.8*  --   --  38.2*  LATICACIDVEN  --  >11.0* 9.3*  --     Liver Function Tests: Recent Labs  Lab 01/15/2020 0921  AST 31  ALT 23  ALKPHOS 141*  BILITOT 1.3*  PROT 6.8  ALBUMIN 2.5*   No results for input(s): LIPASE, AMYLASE in the last 168 hours. No results for input(s): AMMONIA in the last 168 hours.  ABG    Component Value Date/Time   PHART 7.245 (L) 01/03/2020 0352   PCO2ART 27.7 (L) 01/03/2020 0352   PO2ART 109.0 (H) 01/03/2020 0352   HCO3 11.9 (L) 01/03/2020 0352   TCO2 13 (L) 01/03/2020 0352   ACIDBASEDEF 14.0 (H) 01/03/2020 0352   O2SAT 97.0 01/03/2020 0352     Coagulation Profile: No results for input(s): INR, PROTIME in the last 168 hours.  Cardiac Enzymes: No results for input(s): CKTOTAL, CKMB, CKMBINDEX, TROPONINI in the last 168 hours.  HbA1C: Hemoglobin A1C  Date/Time Value Ref Range Status  11/22/2018 03:17 PM 5.4 4.0 - 5.6 % Final   HbA1c, POC (prediabetic range)  Date/Time Value Ref Range Status  10/20/2018 03:16 PM 5.8 5.7 - 6.4 % Final    CBG: Recent Labs  Lab 01/03/20 0211 01/03/20 0259 01/03/20 0353 01/03/20 0503 01/03/20 0610  GLUCAP 362* 348* 331* 277* 235*    Review of Systems:   Unable to obtain  Past Medical History  She,  has a past medical history of Cauda equina compression (HCC), Diabetes mellitus without complication (HCC), and Indwelling urinary catheter fell out (HCC).   Surgical History    Past Surgical  History:  Procedure Laterality Date  . LUMBAR LAMINECTOMY/DECOMPRESSION MICRODISCECTOMY Right 01/17/2019   Procedure: RIGHT LUMBAR FOUR-FIVE MICRODISCECTOMY;  Surgeon: Julio Sicks, MD;  Location: [facility] OR;  Service: Neurosurgery;  Laterality: Right;  . NO PAST SURGERIES       Social History   reports that she has been smoking cigarettes. She has never used smokeless tobacco. She reports that she does not drink alcohol or use drugs.   Family History   Her family history includes Diabetes in her mother; Healthy in her father.   Allergies No Known Allergies    The patient is critically ill with multiple organ systems failure and requires high complexity decision making for assessment and support, frequent evaluation and titration of therapies, application of advanced monitoring technologies and extensive interpretation of multiple databases. Critical Care Time devoted to patient care services described in this note independent of APP/resident time (if applicable)  Is 33 CVP 5 hold the Lasix and let hold the Lasix while still the CBI and minutes.   Virl Diamond MD [facility] Personal pager: [phone] If unanswered, please page CCM On-call: [phone]

## 2020-01-03 NOTE — Progress Notes (Signed)
eLink Physician-Brief Progress Note Patient Name: Danielle Moreno DOB: Nov 29, 1975 MRN: [id number]   Date of Service  01/03/2020  HPI/Events of Note  59F on vent for DKA/Septic Shock /ARF . ABG called PH 7.16, Co2 32, Po2 70, Hco3 11.5. on PRVC 26, Vt510/5/.40. on Hco3 drip at 100 .  LA 9.3 on 3 pressors .   eICU Interventions  Give 1 amp HCo3 . Increase HCO3 drip  150cc/hr ,  Repeat ABG in 2 hr      Intervention Category Major Interventions: Acid-Base disturbance - evaluation and management  Nicklaus Alviar 01/03/2020, 7:32 PM

## 2020-01-03 NOTE — Plan of Care (Signed)
  Problem: Respiratory: Goal: Ability to maintain a clear airway and adequate ventilation will improve Outcome: Progressing   Problem: Activity: Goal: Risk for activity intolerance will decrease Outcome: Not Progressing Note: Pt unresponsive and unable to mobilize at this time    Problem: Nutrition: Goal: Adequate nutrition will be maintained Outcome: Not Progressing Note: Pt has not been started on TF. Still on endotool   Problem: Elimination: Goal: Will not experience complications related to urinary retention Outcome: Not Progressing Note: Pt has not made any urine this shift and only 6cc prior shift. MD aware.   Problem: Activity: Goal: Ability to tolerate increased activity will improve Outcome: Not Progressing

## 2020-01-03 NOTE — Progress Notes (Signed)
eLink Physician-Brief Progress Note Patient Name: Danielle Moreno DOB: February 08, 1976 MRN: [id number]   Date of Service  01/03/2020  HPI/Events of Note  Pt with septic shock, multiple organ systems failure and profound metabolic acidosis, she is on multiple pressors, metabolic acidosis is unresponsive to bicarb boluses and bicarb infusion.  eICU Interventions  Vascath placement by PCCM on the ground requested, Nephrology will put in CRRT orders.        [name] Markevious Ehmke 01/03/2020, 9:32 PM

## 2020-01-03 NOTE — Consult Note (Signed)
WOC Nurse Consult Note: Patient receiving care in [facility] 2M02.  Assisted with positioning patient by primary RN, Misty Stanley. Reason for Consult: groin maceration Wound type: MASD-IT Pressure Injury POA: Yes/No/NA Measurement: primarily affecting left groin area Wound bed: moist, red Drainage (amount, consistency, odor)  Periwound: intact Dressing procedure/placement/frequency: Place as many pads of Aquacel [place] [phone]) into the groin folds as necessary to cover the moist areas. Change daily and prn. Monitor the wound area(s) for worsening of condition such as: Signs/symptoms of infection,  Increase in size,  Development of or worsening of odor, Development of pain, or increased pain at the affected locations.  Notify the medical team if any of these develop.  Thank you for the consult.  Discussed plan of care with the bedside nurse.  WOC nurse will not follow at this time.  Please re-consult the WOC team if needed.  Helmut Muster, RN, MSN, CWOCN, CNS-BC, pager [phone]

## 2020-01-03 NOTE — Consult Note (Signed)
Reason for Consult: AKI Referring Physician: Lamonte Sakai, MD  Danielle Moreno is an 44 y.o. female.  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  EMS was called and noted to have tachycardia, and an elevated blood sugar >500.  She was placed on oxygen and started on IVF's.  In the ED she remained unresponsive and was intubated for airway protection.  She was hypotensive with BP of 76/43, tachycardic, and had some edema.  Labs were notable for glucose of 1719, K 5.8, Na 129, CO2 13, BUN/Cr 33/3.37.  She was admitted to the ICU and started on an insulin drip and IVF's.  We were consulted due to progressive AKI and metabolic acidosis.  The trend in Scr is seen below.    Of note, she had been taking multiple pain medications as well as naproxen and metformin prior to admission.  No ACE or ARB.   Trend in Creatinine: Creatinine, Ser  Date/Time Value Ref Range Status  01/03/2020 02:18 PM 4.57 (H) 0.44 - 1.00 mg/dL Final  01/03/2020 08:32 AM 4.35 (H) 0.44 - 1.00 mg/dL Final  01/03/2020 03:30 AM 4.30 (H) 0.44 - 1.00 mg/dL Final  12/19/2019 09:30 PM 3.96 (H) 0.44 - 1.00 mg/dL Final  12/22/2019 04:54 PM 3.74 (H) 0.44 - 1.00 mg/dL Final  01/08/2020 09:31 AM 2.80 (H) 0.44 - 1.00 mg/dL Final  12/30/2019 09:21 AM 3.37 (H) 0.44 - 1.00 mg/dL Final  01/17/2019 05:41 PM 1.37 (H) 0.44 - 1.00 mg/dL Final  11/22/2018 04:43 PM 0.97 0.57 - 1.00 mg/dL Final    PMH:   Past Medical History:  Diagnosis Date  . Cauda equina compression ([place])   . Diabetes mellitus without complication ([place])   . Indwelling urinary catheter fell out [facility])     PSH:   Past Surgical History:  Procedure Laterality Date  . LUMBAR LAMINECTOMY/DECOMPRESSION MICRODISCECTOMY Right 01/17/2019   Procedure:  RIGHT LUMBAR FOUR-FIVE MICRODISCECTOMY;  Surgeon: Earnie Larsson, MD;  Location: [place];  Service: Neurosurgery;  Laterality: Right;  . NO PAST SURGERIES      Allergies: No Known Allergies  Medications:   Prior to Admission medications   Medication Sig Start Date End Date Taking? Authorizing Provider  Aspirin-Caffeine (BAYER BACK & BODY PAIN EX ST) 500-32.5 MG TABS Take 1-2 tablets by mouth daily as needed (pain).   Yes [provider]  baclofen (LIORESAL) 10 MG tablet Take 10 mg by mouth 3 (three) times daily as needed for muscle spasms. 11/15/19  Yes [provider]  butalbital-acetaminophen-caffeine (FIORICET) 50-325-40 MG tablet Take 1 tablet by mouth 2 (two) times daily as needed for headache.   Yes [provider]  cyclobenzaprine (FLEXERIL) 5 MG tablet Take 5 mg by mouth 3 (three) times daily as needed for muscle spasms.   Yes [provider]  diphenhydrAMINE (BENADRYL) 25 MG tablet Take 25 mg by mouth every 6 (six) hours as needed for itching, allergies or sleep.   Yes [provider]  FERREX 150 150 MG capsule Take 150 mg by mouth daily. 11/03/19  Yes [provider]  gabapentin (NEURONTIN) 300 MG capsule Take 600 mg by mouth in the morning, at noon, and at bedtime. 12/05/19  Yes [provider]  glycerin adult 2 g suppository Place 1 suppository rectally as needed for constipation.  Yes [provider]  guaifenesin (HUMIBID E) 400 MG TABS tablet Take 400 mg by mouth every 6 (six) hours as needed (congestion).   Yes [provider]  Lidocaine-Glycerin (PREPARATION H EX) Apply 1 application topically daily as needed (itching).   Yes [provider]  methocarbamol (ROBAXIN) 500 MG tablet Take 500 mg by mouth 2 (two) times daily as needed for muscle spasms.   Yes [provider]  naproxen (NAPROSYN) 500 MG tablet Take 500 mg by mouth 2 (two) times daily as needed for mild pain.   Yes [provider]  naproxen sodium (ALEVE) 220 MG tablet Take 220-440 mg by mouth daily as needed (pain).   Yes [provider]  nortriptyline (PAMELOR) 25 MG capsule Take 50-75 mg by mouth at bedtime.  11/15/19  Yes [provider]  oxybutynin (DITROPAN-XL) 10 MG 24 hr tablet Take 10 mg by mouth daily. 11/09/19  Yes [provider]  propranolol (INDERAL) 10 MG tablet Take 10 mg by mouth 2 (two) times daily. 12/05/19  Yes [provider]  sennosides-docusate sodium (SENOKOT-S) 8.6-50 MG tablet Take 1 tablet by mouth daily as needed for constipation.   Yes [provider]  traZODone (DESYREL) 50 MG tablet Take 50 mg by mouth at bedtime as needed for sleep.   Yes [provider]  cyclobenzaprine (FLEXERIL) 10 MG tablet Take 1 tablet (10 mg total) by mouth 3 (three) times daily as needed for muscle spasms. 01/20/19   Earnie Larsson, MD  DULoxetine (CYMBALTA) 30 MG capsule Take 30-60 mg by mouth daily. 11/15/19   [provider]  ELIQUIS 5 MG TABS tablet Take 5 mg by mouth 2 (two) times daily. 11/03/19   [provider]  HYDROcodone-acetaminophen (NORCO/VICODIN) 5-325 MG tablet Take 1 tablet by mouth every 4 (four) hours as needed for moderate pain ((score 4 to 6)). 01/20/19   Earnie Larsson, MD  metFORMIN (GLUCOPHAGE) 500 MG tablet Take 1 tablet (500 mg total) by mouth 2 (two) times daily with a meal. 10/20/18   McClung, Dionne Bucy, PA-C  oxyCODONE-acetaminophen (PERCOCET) 10-325 MG tablet Take 1-2 tablets by mouth every 4 (four) hours as needed for severe pain. 11/15/19   [provider]  pravastatin (PRAVACHOL) 40 MG tablet Take 40 mg by mouth daily. 12/02/19   [provider]  rosuvastatin (CRESTOR) 10 MG tablet Take 1 tablet (10 mg total) by mouth daily. To lower cholesterol Patient not taking: Reported on 01/17/2019 12/09/18   Antony Blackbird, MD    Inpatient medications: . apixaban  5 mg Per Tube BID  . chlorhexidine gluconate  (MEDLINE KIT)  15 mL Mouth Rinse BID  . Chlorhexidine Gluconate Cloth  6 each Topical Q0600  . DULoxetine  30-60 mg Oral Daily  . [START ON 01/20/2020] famotidine  20 mg Per Tube Daily  . fentaNYL (SUBLIMAZE) injection  50 mcg Intravenous Once  . fentaNYL (SUBLIMAZE) injection  50 mcg Intravenous Once  . mouth rinse  15 mL Mouth Rinse 10 times per day    Discontinued Meds:   Medications Discontinued During This Encounter  Medication Reason  . vancomycin (VANCOCIN) IVPB 1000 mg/200 mL premix   . vancomycin (VANCOREADY) IVPB 750 mg/150 mL   . calcium gluconate 1 g in sodium chloride 0.9 % 100 mL IVPB   . fentaNYL 2529mg in NS 2524m(1016mml) infusion-PREMIX   . fentaNYL (SUBLIMAZE) bolus via infusion 50 mcg   . norepinephrine (LEVOPHED) 4mg51m 250mL14mmix infusion   .  ceFEPIme (MAXIPIME) 2 g in sodium chloride 0.9 % 100 mL IVPB   . vancomycin (VANCOREADY) IVPB 1250 mg/250 mL   . dextrose 5 %-0.45 % sodium chloride infusion   . chlorhexidine gluconate (MEDLINE KIT) (PERIDEX) 0.12 % solution 15 mL   . MEDLINE mouth rinse   . lactated ringers infusion   . piperacillin-tazobactam (ZOSYN) IVPB 3.375 g   . furosemide (LASIX) injection 40 mg   . heparin injection 5,000 Units   . famotidine (PEPCID) 40 MG/5ML suspension 20 mg   . norepinephrine (LEVOPHED) 16 mg in 278m premix infusion Dose change    Social History:  reports that she has been smoking cigarettes. She has never used smokeless tobacco. She reports that she does not drink alcohol or use drugs.  Family History:   Family History  Problem Relation Age of Onset  . Diabetes Mother   . Healthy Father     Review of systems not obtained due to patient factors. Weight change:   Intake/Output Summary (Last 24 hours) at 01/03/2020 1457 Last data filed at 01/03/2020 1423 Gross per 24 hour  Intake 6882.79 ml  Output 16 ml  Net 6866.79 ml   BP (!) 75/55   Pulse (!) 122   Temp 99 F (37.2 C) (Bladder)   Resp (!) 24   Ht 5'  8" (1.727 m)   Wt 123 kg   SpO2 (!) 81%   BMI 41.23 kg/m  Vitals:   01/03/20 1100 01/03/20 1129 01/03/20 1200 01/03/20 1400  BP:  (!) 102/55 121/74 (!) 75/55  Pulse:  (!) 104  (!) 122  Resp:  (!) 31 (!) 30 (!) 24  Temp: 99 F (37.2 C)     TempSrc: Bladder     SpO2:  93%  (!) 81%  Weight:      Height:         General appearance: moderately obese and intubated and sedated Head: Normocephalic, without obvious abnormality, atraumatic Resp: rhonchi bilaterally Cardio: tachycardic at 122, no rub appreciated GI: obese, hypoactive bowel sounds Extremities: edema trace pretibial   Labs: Basic Metabolic Panel: Recent Labs  Lab 12/21/2019 0921 01/13/2020 0921 01/06/2020 0931 12/30/2019 1045 12/24/2019 1654 01/12/2020 1654 12/19/2019 1724 12/24/2019 2130 12/29/2019 2203 01/03/20 0220 01/03/20 0330 01/03/20 0352 01/03/20 0832 01/03/20 1418  NA 129*   < > 130*   < > 147*   < > 143 146* 148*  --  147* 147* 146* 145  K 5.8*   < > 6.0*   < > 4.1   < > 4.2 4.2 3.9  --  4.5 4.4 4.5 4.6  CL 88*  --  98  --  107  --   --  114*  --   --  113*  --  116* 115*  CO2 13*  --   --   --  15*  --   --  12*  --   --  11*  --  11* 11*  GLUCOSE 1,719*  --  >700*  --  829*  --   --  538*  --   --  365*  --  274* 191*  BUN 33*  --  51*  --  35*  --   --  31*  --   --  31*  --  33* 33*  CREATININE 3.37*  --  2.80*  --  3.74*  --   --  3.96*  --   --  4.30*  --  4.35* 4.57*  ALBUMIN 2.5*  --   --   --   --   --   --   --   --   --   --   --   --   --   CALCIUM 10.0  --   --   --  8.6*  --   --  8.4*  --   --  8.1*  --  7.6* 7.2*  PHOS  --   --   --   --   --   --   --   --   --  4.9*  --   --   --   --    < > = values in this interval not displayed.   Liver Function Tests: Recent Labs  Lab 12/20/2019 0921  AST 31  ALT 23  ALKPHOS 141*  BILITOT 1.3*  PROT 6.8  ALBUMIN 2.5*   No results for input(s): LIPASE, AMYLASE in the last 168 hours. No results for input(s): AMMONIA in the last 168 hours. CBC: Recent  Labs  Lab 12/29/2019 0921 01/05/2020 0931 12/31/2019 1724 12/19/2019 2203 01/03/20 0220 01/03/20 0352  WBC 30.8*  --   --   --  38.2*  --   HGB 15.2*   < > 14.3 13.9 13.4 13.3  HCT 54.3*   < > 42.0 41.0 43.5 39.0  MCV 103.6*  --   --   --  94.2  --   PLT 489*  --   --   --  137*  --    < > = values in this interval not displayed.   PT/INR: _0 (inr:5) Cardiac Enzymes: )No results for input(s): CKTOTAL, CKMB, CKMBINDEX, TROPONINI in the last 168 hours. CBG: Recent Labs  Lab 01/03/20 1057 01/03/20 1159 01/03/20 1254 01/03/20 1359 01/03/20 1450  GLUCAP 229* 195* 200* 183* 166*    Iron Studies: No results for input(s): IRON, TIBC, TRANSFERRIN, FERRITIN in the last 168 hours.  Xrays/Other Studies: CT Head Wo Contrast  Result Date: 12/19/2019 CLINICAL DATA:  Neuro deficits EXAM: CT HEAD WITHOUT CONTRAST TECHNIQUE: Contiguous axial images were obtained from the base of the skull through the vertex without intravenous contrast. COMPARISON:  None. FINDINGS: Brain: No evidence of acute infarction, hemorrhage, hydrocephalus, extra-axial collection or mass lesion/mass effect. Vascular: No hyperdense vessel or unexpected calcification. Skull: Normal. Negative for fracture or focal lesion. Sinuses/Orbits: No acute finding. Other: None. IMPRESSION: Normal head CT for age Electronically Signed   By: Inez Catalina M.D.   On: 12/23/2019 10:31   US RENAL  Result Date: 01/03/2020 CLINICAL DATA:  Acute kidney injury. EXAM: RENAL / URINARY TRACT ULTRASOUND COMPLETE COMPARISON:  Abdominal CT 01/25/2019 at [facility] FINDINGS: Right Kidney: Renal measurements: 10.6 x 4.2 x 4.7 cm = volume: 108 mL. Echogenicity within normal limits. No mass or hydronephrosis visualized. Left Kidney: Not visualized.  ICU patient unable to cooperate with positioning. Bladder: Not visualized. Other: Technically limited exam due to patient body habitus and medical condition. Incidental hepatic steatosis. IMPRESSION: 1.  Unremarkable sonographic appearance of the right kidney. No hydronephrosis. 2. Left kidney and urinary bladder not visualized sonographically due to limitations with positioning and body habitus. Electronically Signed   By: Keith Rake M.D.   On: 01/03/2020 03:13   DG Chest Port 1 View  Result Date: 01/03/2020 CLINICAL DATA:  Respiratory failure EXAM: PORTABLE CHEST 1 VIEW COMPARISON:  12/25/2019 FINDINGS: Endotracheal tube, gastric catheter and right jugular central line are again seen and stable. The  overall inspiratory effort is poor. Mild bibasilar atelectatic changes are seen. No bony abnormality is noted. IMPRESSION: Tubes and lines as described above. Stable bibasilar atelectasis. Electronically Signed   By: Inez Catalina M.D.   On: 01/03/2020 08:26   DG Chest Portable 1 View  Result Date: 12/31/2019 CLINICAL DATA:  Central catheter placement.  Hypoxia. EXAM: PORTABLE CHEST 1 VIEW COMPARISON:  January 02, 2020 study obtained earlier in the day FINDINGS: Central catheter tip is in the superior vena cava. Endotracheal tube tip is 2.2 cm above the carina. Nasogastric tube tip and side port are below the diaphragm. No pneumothorax. There is mild bibasilar atelectasis. Lungs elsewhere are clear. Heart size and pulmonary vascularity are normal. No adenopathy. No bone lesions. IMPRESSION: Tube and catheter positions as described without pneumothorax. Bibasilar atelectasis. Lungs elsewhere clear. Stable cardiac silhouette. Electronically Signed   By: Lowella Grip III M.D.   On: 01/08/2020 15:15   DG Chest Portable 1 View  Result Date: 01/03/2020 CLINICAL DATA:  Post intubation. Additional history provided: Unresponsive; post intubation. EXAM: PORTABLE CHEST 1 VIEW COMPARISON:  CTA chest 01/25/2019 FINDINGS: ET tube terminates 3.1 cm above the level of the carina. An enteric tube passes below level of left hemidiaphragm with tip excluded from the field of view. Overlying cardiac monitoring leads.  Heart size within normal limits. Shallow inspiration radiograph with bibasilar atelectasis. No evidence of pleural effusion or pneumothorax. No acute bony abnormality. IMPRESSION: Support apparatus as described. ET tube terminates 3.1 cm above the level of the carina. Shallow inspiration radiograph with bibasilar atelectasis. Electronically Signed   By: Kellie Simmering DO   On: 01/13/2020 09:51   DG Abd Portable 1V  Result Date: 01/13/2020 CLINICAL DATA:  44 year old female admitted for unresponsiveness, intubated. Orogastric tube placement. EXAM: PORTABLE ABDOMEN - 1 VIEW COMPARISON:  CT Abdomen and Pelvis 01/25/2019. FINDINGS: AP view of the abdomen at 1720 hours. Enteric tube placed into the stomach. Tip is at the level of the distal gastric body. Side hole at the level of the mid gastric body. Respiratory motion at the lung bases. Negative visible bowel gas pattern and osseous structures. IMPRESSION: Enteric tube placed into the stomach with side hole at the gastric body. Electronically Signed   By: Genevie Ann M.D.   On: 01/08/2020 17:36     Assessment/Plan: 1.  Oliguric AKI- in setting of DKA and septic shock.  Not responding to IVF's and requiring high dose pressors for BP management.  No urgent indication for initiation of CVVHD but will likely need to proceed in the next 24 hours if she doesn't improve. 2. Metabolic acidosis due to #1 and metformin use.  Not improving with control of DKA.   1. Start isotonic bicarb drip and follow 3. DKA- on insulin drip 4. VDRF- per PCCM 5. Septic shock- unclear etiology.  Cultures pending.  Possible urosepsis with chronic foley catheter.   1. Currently on levophed and vasopressin 2. Given Vanco, cefepime, flagyl, and zosyn per PCCM 6. Acute encephalopathy- presumably due to DKA but also on baclofen and opioids at home. 7. Urinary retention- due to cauda equina syndrome- has chronic foley.  Renal US unable to visualize bladder or left kidney due to inability to  change position.  Consider ct scan when stable to r/o obstruction/abscess/stone 8. Hypernatremia- improving with 1/2 NS 9. Cauda equina syndrome   Donetta Potts 01/03/2020, 2:57 PM

## 2020-01-03 NOTE — Procedures (Signed)
Central Venous Hemodialysis Catheter Insertion Procedure Note Danielle Moreno [id number] 03-Jun-1976  Procedure: Insertion of Central Venous Hemodialysis Catheter Indications: CRRT and acute renal failure  Procedure Details Consent: Risks of procedure as well as the alternatives and risks of each were explained to the (patient/caregiver).  Consent for procedure obtained. Time Out: Verified patient identification, verified procedure, site/side was marked, verified correct patient position, special equipment/implants available, medications/allergies/relevent history reviewed, required imaging and test results available.  Performed  Maximum sterile technique was used including antiseptics, cap, gloves, gown, hand hygiene, mask and sheet. Skin prep: Chlorhexidine; local anesthetic administered Lido 1% 4 ml.  A 15 cm  triple lumen trialysis catheter was placed in the right femoral vein due to existing RIJ CVL, very small left IJ in which did not look adequate for size of catheter needed, and fold excoriations in left femoral.   Difficult access due to small size of vessel and body habitus.  All lines with blood return in all ports and flushed with caps placed.  Line sutured.  Biopatch placed and sterile dressing applied.    RN to flush red /blue ports with heparin till CRRT is running.   Evaluation Blood flow good Complications: No apparent complications Patient did tolerate procedure well. Chest X-ray ordered to verify placement.  CXR: n/a.  Procedure performed with ultrasound guidance for real time vessel cannulation.      Posey Boyer, MSN, AGACNP-BC [facility] 01/03/2020, 10:29 PM

## 2020-01-03 NOTE — Progress Notes (Signed)
Inpatient Diabetes Program Recommendations  AACE/ADA: New Consensus Statement on Inpatient Glycemic Control (2015)  Target Ranges:  Prepandial:   less than 140 mg/dL      Peak postprandial:   less than 180 mg/dL (1-2 hours)      Critically ill patients:  140 - 180 mg/dL   Lab Results  Component Value Date   GLUCAP 240 (H) 01/03/2020   HGBA1C 5.4 11/22/2018    Review of Glycemic Control Results for Danielle Moreno, Danielle Moreno (MRN [id number]) as of 01/03/2020 09:46  Ref. Range 01/03/2020 07:03 01/03/2020 07:56 01/03/2020 08:52  Glucose-Capillary Latest Ref Range: 70 - 99 mg/dL 096 (H) 438 (H) 381 (H)   Diabetes history: Type 2 DM Outpatient Diabetes medications: Metformin 500 mg BID Current orders for Inpatient glycemic control: IV insulin  Inpatient Diabetes Program Recommendations:    Given IV insulin drip rates and BMET this AM, recommending plan to continue with IV insulin.   Last A1C was 5.4%, consider repeating A1C.   Thanks, Lujean Rave, MSN, RNC-OB Diabetes Coordinator [phone] (8a-5p)

## 2020-01-03 NOTE — Progress Notes (Addendum)
Pharmacy Antibiotic Note  Danielle Moreno is a 44 y.o. female admitted on 01/14/2020 as unresponsive, now intubated. Pharmacy has been consulted for vancomycin/cefepime/Flagyl dosing for sepsis of unknown source - day #2. SCr trended up from 3.96 to 4.77 today. Nephrology planning to start CRRT 2/16 PM. Pharmacy will adjust antibiotic dosing appropriately.   Plan: When CRRT starts: Adjust cefepime to 2g IV q12h Adjust vancomycin to 1250mg  (~10mg /kg) IV q24h Continue Flagyl 500mg  IV q8h Monitor clinical progress, c/s, abx plan/LOT, vancomycin level as indicated F/u CRRT tolerance/off time   Height: 5\' 8"  (172.7 cm) Weight: 271 lb 2.7 oz (123 kg) IBW/kg (Calculated) : 63.9  Temp (24hrs), Avg:100.4 F (38 C), Min:98.8 F (37.1 C), Max:102.2 F (39 C)  Recent Labs  Lab 01/06/2020 0921 01/14/2020 0931 12/21/2019 1125 01/01/2020 1654 12/23/2019 2130 01/03/20 0220 01/03/20 0330 01/03/20 0832 01/03/20 1418 01/03/20 1818  WBC 30.8*  --   --   --   --  38.2*  --   --   --   --   CREATININE 3.37*   < >  --    < > 3.96*  --  4.30* 4.35* 4.57* 4.77*  LATICACIDVEN  --   --  >11.0*  --  9.3*  --   --   --   --   --    < > = values in this interval not displayed.    Estimated Creatinine Clearance: 20.4 mL/min (A) (by C-G formula based on SCr of 4.77 mg/dL (H)).    No Known Allergies  Antimicrobials this admission: Vancomycin 2/15 >> Cefepime 2/15 >> Metronidazole 2/15 >> Zosyn 2/15>>2/16  Microbiology results: 2/15 BCx: ngtd 2/15 COVID/flu: neg 2/15 MRSA PCR: neg 2/15 TA: few group B strep, mod yeast 2/15 Covid 19: negative 2/16 UC - sent   3/15, PharmD, BCPS Please check AMION for all [facility] contact numbers Clinical Pharmacist 01/03/2020 8:32 PM

## 2020-01-03 NOTE — Plan of Care (Signed)
AKI and Acidosis - ABG is worsening despite bicarb gtt and patient is anuric, on multiple pressors.  Spoke with patient's husband regarding the risks/benefits, and indications for renal replacement therapy and he provided consent for renal replacement therapy.  Will initiate CRRT.  Spoke with pulm/critical care ([facility]) and they agree and will place a dialysis catheter.  Appreciate their assistance.  Estanislado Emms 01/03/2020 8:20 PM

## 2020-01-04 ENCOUNTER — Inpatient Hospital Stay (HOSPITAL_COMMUNITY): Payer: 59

## 2020-01-04 DIAGNOSIS — I428 Other cardiomyopathies: Secondary | ICD-10-CM

## 2020-01-04 DIAGNOSIS — I469 Cardiac arrest, cause unspecified: Secondary | ICD-10-CM

## 2020-01-04 LAB — CBC
HCT: 29.1 % — ABNORMAL LOW (ref 36.0–46.0)
HCT: 24.9 % — ABNORMAL LOW (ref 36.0–46.0)
Hemoglobin: 8.8 g/dL — ABNORMAL LOW (ref 12.0–15.0)
Hemoglobin: 7.3 g/dL — ABNORMAL LOW (ref 12.0–15.0)
MCH: 28.8 pg (ref 26.0–34.0)
MCH: 28.9 pg (ref 26.0–34.0)
MCHC: 30.2 g/dL (ref 30.0–36.0)
MCHC: 29.3 g/dL — ABNORMAL LOW (ref 30.0–36.0)
MCV: 95.1 fL (ref 80.0–100.0)
MCV: 98.4 fL (ref 80.0–100.0)
Platelets: 47 10*3/uL — ABNORMAL LOW (ref 150–400)
Platelets: 34 10*3/uL — ABNORMAL LOW (ref 150–400)
RBC: 3.06 MIL/uL — ABNORMAL LOW (ref 3.87–5.11)
RBC: 2.53 MIL/uL — ABNORMAL LOW (ref 3.87–5.11)
RDW: 15.4 % (ref 11.5–15.5)
RDW: 15.7 % — ABNORMAL HIGH (ref 11.5–15.5)
WBC: 13.5 10*3/uL — ABNORMAL HIGH (ref 4.0–10.5)
WBC: 13.5 10*3/uL — ABNORMAL HIGH (ref 4.0–10.5)
nRBC: 0.9 % — ABNORMAL HIGH (ref 0.0–0.2)
nRBC: 1.4 % — ABNORMAL HIGH (ref 0.0–0.2)

## 2020-01-04 LAB — POCT I-STAT 7, (LYTES, BLD GAS, ICA,H+H)
Acid-base deficit: 17 mmol/L — ABNORMAL HIGH (ref 0.0–2.0)
Acid-base deficit: 13 mmol/L — ABNORMAL HIGH (ref 0.0–2.0)
Acid-base deficit: 20 mmol/L — ABNORMAL HIGH (ref 0.0–2.0)
Acid-base deficit: 11 mmol/L — ABNORMAL HIGH (ref 0.0–2.0)
Bicarbonate: 10.3 mmol/L — ABNORMAL LOW (ref 20.0–28.0)
Bicarbonate: 12.7 mmol/L — ABNORMAL LOW (ref 20.0–28.0)
Bicarbonate: 7.1 mmol/L — ABNORMAL LOW (ref 20.0–28.0)
Bicarbonate: 14.5 mmol/L — ABNORMAL LOW (ref 20.0–28.0)
Calcium, Ion: 0.91 mmol/L — ABNORMAL LOW (ref 1.15–1.40)
Calcium, Ion: 0.8 mmol/L — CL (ref 1.15–1.40)
Calcium, Ion: 1.13 mmol/L — ABNORMAL LOW (ref 1.15–1.40)
Calcium, Ion: 0.74 mmol/L — CL (ref 1.15–1.40)
HCT: 27 % — ABNORMAL LOW (ref 36.0–46.0)
HCT: 23 % — ABNORMAL LOW (ref 36.0–46.0)
HCT: 23 % — ABNORMAL LOW (ref 36.0–46.0)
HCT: 20 % — ABNORMAL LOW (ref 36.0–46.0)
Hemoglobin: 9.2 g/dL — ABNORMAL LOW (ref 12.0–15.0)
Hemoglobin: 7.8 g/dL — ABNORMAL LOW (ref 12.0–15.0)
Hemoglobin: 7.8 g/dL — ABNORMAL LOW (ref 12.0–15.0)
Hemoglobin: 6.8 g/dL — CL (ref 12.0–15.0)
O2 Saturation: 86 %
O2 Saturation: 92 %
O2 Saturation: 97 %
O2 Saturation: 100 %
Patient temperature: 36
Patient temperature: 94.6
Patient temperature: 94.7
Patient temperature: 35.7
Potassium: 4.8 mmol/L (ref 3.5–5.1)
Potassium: 4.4 mmol/L (ref 3.5–5.1)
Potassium: 5.4 mmol/L — ABNORMAL HIGH (ref 3.5–5.1)
Potassium: 5.6 mmol/L — ABNORMAL HIGH (ref 3.5–5.1)
Sodium: 146 mmol/L — ABNORMAL HIGH (ref 135–145)
Sodium: 147 mmol/L — ABNORMAL HIGH (ref 135–145)
Sodium: 141 mmol/L (ref 135–145)
Sodium: 143 mmol/L (ref 135–145)
TCO2: 11 mmol/L — ABNORMAL LOW (ref 22–32)
TCO2: 14 mmol/L — ABNORMAL LOW (ref 22–32)
TCO2: 8 mmol/L — ABNORMAL LOW (ref 22–32)
TCO2: 15 mmol/L — ABNORMAL LOW (ref 22–32)
pCO2 arterial: 26.4 mmHg — ABNORMAL LOW (ref 32.0–48.0)
pCO2 arterial: 27 mmHg — ABNORMAL LOW (ref 32.0–48.0)
pCO2 arterial: 18.7 mmHg — CL (ref 32.0–48.0)
pCO2 arterial: 28.9 mmHg — ABNORMAL LOW (ref 32.0–48.0)
pH, Arterial: 7.194 — CL (ref 7.350–7.450)
pH, Arterial: 7.269 — ABNORMAL LOW (ref 7.350–7.450)
pH, Arterial: 7.173 — CL (ref 7.350–7.450)
pH, Arterial: 7.3 — ABNORMAL LOW (ref 7.350–7.450)
pO2, Arterial: 58 mmHg — ABNORMAL LOW (ref 83.0–108.0)
pO2, Arterial: 62 mmHg — ABNORMAL LOW (ref 83.0–108.0)
pO2, Arterial: 104 mmHg (ref 83.0–108.0)
pO2, Arterial: 296 mmHg — ABNORMAL HIGH (ref 83.0–108.0)

## 2020-01-04 LAB — GLUCOSE, CAPILLARY
Glucose-Capillary: 100 mg/dL — ABNORMAL HIGH (ref 70–99)
Glucose-Capillary: 102 mg/dL — ABNORMAL HIGH (ref 70–99)
Glucose-Capillary: 113 mg/dL — ABNORMAL HIGH (ref 70–99)
Glucose-Capillary: 122 mg/dL — ABNORMAL HIGH (ref 70–99)
Glucose-Capillary: 126 mg/dL — ABNORMAL HIGH (ref 70–99)
Glucose-Capillary: 129 mg/dL — ABNORMAL HIGH (ref 70–99)
Glucose-Capillary: 129 mg/dL — ABNORMAL HIGH (ref 70–99)
Glucose-Capillary: 49 mg/dL — ABNORMAL LOW (ref 70–99)
Glucose-Capillary: 71 mg/dL (ref 70–99)
Glucose-Capillary: 73 mg/dL (ref 70–99)
Glucose-Capillary: 76 mg/dL (ref 70–99)
Glucose-Capillary: 83 mg/dL (ref 70–99)
Glucose-Capillary: 93 mg/dL (ref 70–99)
Glucose-Capillary: 97 mg/dL (ref 70–99)

## 2020-01-04 LAB — CULTURE, RESPIRATORY W GRAM STAIN: Gram Stain: NONE SEEN

## 2020-01-04 LAB — DIC (DISSEMINATED INTRAVASCULAR COAGULATION)PANEL
D-Dimer, Quant: >20 ug/mL-FEU — ABNORMAL HIGH (ref 0.00–0.50)
Fibrinogen: 148 mg/dL — ABNORMAL LOW (ref 210–475)
INR: 5.5 (ref 0.8–1.2)
Platelets: 36 10*3/uL — ABNORMAL LOW (ref 150–400)
Prothrombin Time: 50.2 seconds — ABNORMAL HIGH (ref 11.4–15.2)
Smear Review: NONE SEEN
aPTT: 64 seconds — ABNORMAL HIGH (ref 24–36)

## 2020-01-04 LAB — RENAL FUNCTION PANEL
Albumin: 1.6 g/dL — ABNORMAL LOW (ref 3.5–5.0)
Anion gap: 27 — ABNORMAL HIGH (ref 5–15)
BUN: 26 mg/dL — ABNORMAL HIGH (ref 6–20)
CO2: 11 mmol/L — ABNORMAL LOW (ref 22–32)
Calcium: 6.3 mg/dL — CL (ref 8.9–10.3)
Chloride: 109 mmol/L (ref 98–111)
Creatinine, Ser: 3.91 mg/dL — ABNORMAL HIGH (ref 0.44–1.00)
GFR calc Af Amer: 15 mL/min — ABNORMAL LOW (ref >60)
GFR calc non Af Amer: 13 mL/min — ABNORMAL LOW (ref >60)
Glucose, Bld: 121 mg/dL — ABNORMAL HIGH (ref 70–99)
Phosphorus: 6.5 mg/dL — ABNORMAL HIGH (ref 2.5–4.6)
Potassium: 4.8 mmol/L (ref 3.5–5.1)
Sodium: 147 mmol/L — ABNORMAL HIGH (ref 135–145)

## 2020-01-04 LAB — BETA-HYDROXYBUTYRIC ACID
Beta-Hydroxybutyric Acid: 0.57 mmol/L — ABNORMAL HIGH (ref 0.05–0.27)
Beta-Hydroxybutyric Acid: 0.73 mmol/L — ABNORMAL HIGH (ref 0.05–0.27)

## 2020-01-04 LAB — PROCALCITONIN: Procalcitonin: 13.99 ng/mL

## 2020-01-04 LAB — URINE CULTURE

## 2020-01-04 LAB — ECHOCARDIOGRAM LIMITED
Height: 68 in
Weight: 4391.56 oz

## 2020-01-04 LAB — VANCOMYCIN, RANDOM: Vancomycin Rm: 18

## 2020-01-04 LAB — LACTIC ACID, PLASMA
Lactic Acid, Venous: >11 mmol/L (ref 0.5–1.9)
Lactic Acid, Venous: >11 mmol/L (ref 0.5–1.9)

## 2020-01-04 LAB — MAGNESIUM: Magnesium: 1.8 mg/dL (ref 1.7–2.4)

## 2020-01-04 MED ORDER — SODIUM BICARBONATE 8.4 % IV SOLN
50.0000 meq | Freq: Once | INTRAVENOUS | Status: AC
  Administered 2020-01-04: 01:00:00 50 meq via INTRAVENOUS
  Filled 2020-01-04: qty 50

## 2020-01-04 MED ORDER — SODIUM BICARBONATE 8.4 % IV SOLN
INTRAVENOUS | Status: AC
  Filled 2020-01-04: qty 100

## 2020-01-04 MED ORDER — PHENYLEPHRINE CONCENTRATED 100MG/250ML (0.4 MG/ML) INFUSION SIMPLE
0.0000 ug/min | INTRAVENOUS | Status: DC
  Administered 2020-01-04 (×2): 400 ug/min via INTRAVENOUS
  Administered 2020-01-04: 125 ug/min via INTRAVENOUS
  Filled 2020-01-04 (×4): qty 250

## 2020-01-04 MED ORDER — SODIUM CHLORIDE 0.9 % IV SOLN
200.0000 mg | Freq: Once | INTRAVENOUS | Status: AC
  Administered 2020-01-04: 13:00:00 200 mg via INTRAVENOUS
  Filled 2020-01-04: qty 200

## 2020-01-04 MED ORDER — STERILE WATER FOR INJECTION IV SOLN
INTRAVENOUS | Status: DC
  Filled 2020-01-04 (×3): qty 150

## 2020-01-04 MED ORDER — CALCIUM GLUCONATE-NACL 1-0.675 GM/50ML-% IV SOLN
1.0000 g | Freq: Once | INTRAVENOUS | Status: AC
  Administered 2020-01-04: 1000 mg via INTRAVENOUS
  Filled 2020-01-04: qty 50

## 2020-01-04 MED ORDER — SODIUM CHLORIDE 0.9 % IV BOLUS
500.0000 mL | Freq: Once | INTRAVENOUS | Status: AC
  Administered 2020-01-04: 500 mL via INTRAVENOUS

## 2020-01-04 MED ORDER — DEXTROSE 50 % IV SOLN
INTRAVENOUS | Status: AC
  Filled 2020-01-04: qty 50

## 2020-01-04 MED ORDER — SODIUM CHLORIDE 0.9 % IV SOLN
2.0000 g | Freq: Two times a day (BID) | INTRAVENOUS | Status: DC
  Administered 2020-01-04: 10:00:00 2 g via INTRAVENOUS
  Filled 2020-01-04: qty 2

## 2020-01-04 MED ORDER — ALBUMIN HUMAN 25 % IV SOLN
25.0000 g | Freq: Once | INTRAVENOUS | Status: AC
  Administered 2020-01-04: 05:00:00 25 g via INTRAVENOUS
  Filled 2020-01-04: qty 100

## 2020-01-04 MED ORDER — SODIUM BICARBONATE 8.4 % IV SOLN
100.0000 meq | Freq: Once | INTRAVENOUS | Status: AC
  Administered 2020-01-04: 12:00:00 100 meq via INTRAVENOUS
  Filled 2020-01-04: qty 100

## 2020-01-04 MED ORDER — EPINEPHRINE HCL 5 MG/250ML IV SOLN IN NS
0.5000 ug/min | INTRAVENOUS | Status: DC
  Administered 2020-01-04: 2 ug/min via INTRAVENOUS
  Filled 2020-01-04 (×2): qty 250

## 2020-01-04 MED ORDER — HYDROCORTISONE NA SUCCINATE PF 100 MG IJ SOLR
50.0000 mg | Freq: Four times a day (QID) | INTRAMUSCULAR | Status: DC
  Administered 2020-01-04: 50 mg via INTRAVENOUS
  Filled 2020-01-04: qty 2

## 2020-01-04 MED ORDER — SODIUM BICARBONATE 8.4 % IV SOLN: 50.0000 meq | Freq: Once | INTRAVENOUS | Status: AC

## 2020-01-04 MED ORDER — SODIUM CHLORIDE 0.9 % IV SOLN: 100.0000 mg | INTRAVENOUS | Status: DC

## 2020-01-04 MED ORDER — VANCOMYCIN HCL 1250 MG/250ML IV SOLN
1250.0000 mg | INTRAVENOUS | Status: DC
  Administered 2020-01-04: 1250 mg via INTRAVENOUS
  Filled 2020-01-04 (×2): qty 250

## 2020-01-04 MED ORDER — ALBUMIN HUMAN 25 % IV SOLN
25.0000 g | Freq: Four times a day (QID) | INTRAVENOUS | Status: DC
  Administered 2020-01-04: 25 g via INTRAVENOUS
  Filled 2020-01-04 (×2): qty 100

## 2020-01-04 MED ORDER — SODIUM BICARBONATE 8.4 % IV SOLN
50.0000 meq | Freq: Once | INTRAVENOUS | Status: AC
  Administered 2020-01-04: 05:00:00 50 meq via INTRAVENOUS

## 2020-01-04 MED ORDER — SODIUM BICARBONATE 8.4 % IV SOLN
INTRAVENOUS | Status: AC
  Administered 2020-01-04: 50 meq
  Filled 2020-01-04: qty 50

## 2020-01-04 MED ORDER — HEPARIN SODIUM (PORCINE) 1000 UNIT/ML DIALYSIS
1000.0000 [IU] | INTRAMUSCULAR | Status: DC | PRN
  Administered 2020-01-04: 2700 [IU] via INTRAVENOUS_CENTRAL
  Filled 2020-01-04 (×3): qty 6
  Filled 2020-01-04: qty 5

## 2020-01-04 MED ORDER — ALBUMIN HUMAN 5 % IV SOLN
12.5000 g | Freq: Once | INTRAVENOUS | Status: AC
  Administered 2020-01-04: 12.5 g via INTRAVENOUS
  Filled 2020-01-04: qty 250

## 2020-01-04 MED ORDER — STERILE WATER FOR INJECTION IV SOLN
INTRAVENOUS | Status: DC
  Filled 2020-01-04 (×2): qty 150

## 2020-01-04 MED ORDER — SODIUM CHLORIDE 0.9 % IV SOLN
2.0000 g | Freq: Once | INTRAVENOUS | Status: AC
  Administered 2020-01-04: 2 g via INTRAVENOUS
  Filled 2020-01-04: qty 20

## 2020-01-04 MED ORDER — SODIUM BICARBONATE 8.4 % IV SOLN
100.0000 meq | Freq: Once | INTRAVENOUS | Status: AC
  Administered 2020-01-04: 03:00:00 100 meq via INTRAVENOUS

## 2020-01-04 MED FILL — Medication: Qty: 1 | Status: AC

## 2020-01-05 MED FILL — Medication: Qty: 1 | Status: AC

## 2020-01-05 MED FILL — Sodium Chloride IV Soln 0.9%: INTRAVENOUS | Qty: 250 | Status: AC

## 2020-01-05 MED FILL — Phenylephrine HCl IV Soln 10 MG/ML: INTRAVENOUS | Qty: 10 | Status: AC

## 2020-01-07 LAB — CULTURE, BLOOD (ROUTINE X 2)
Culture: NO GROWTH
Culture: NO GROWTH
Special Requests: ADEQUATE
Special Requests: ADEQUATE

## 2020-01-16 NOTE — Plan of Care (Signed)
[facility] notified of patient's passing. Referral [phone]. Possible donor for tissue and eyes. Bedside RN Misty Stanley notified to complete eye prep once family has left.

## 2020-01-16 NOTE — Progress Notes (Signed)
CRITICAL VALUE ALERT  Critical Value:  INR 5.5  Date & Time Notied:  Jan 30, 2020 at 0945  Provider Notified: Dr. Katrinka Blazing   Orders Received/Actions taken: No new orders given

## 2020-01-16 NOTE — Progress Notes (Signed)
Patient ID: Danielle Moreno, female   DOB: [birth year], 44 y.o.   MRN: [id number] S: Started on CVVHD late yesterday evening due to progressive acidosis and hypotension. O:BP (!) 114/51   Pulse 69   Temp (!) 95 F (35 C)   Resp (!) 27   Ht '5\' 8"'$  (1.727 m)   Wt 124.5 kg   SpO2 100%   BMI 41.73 kg/m   Intake/Output Summary (Last 24 hours) at 2020/01/16 1142 Last data filed at Jan 16, 2020 1100 Gross per 24 hour  Intake 11865.91 ml  Output 628 ml  Net 11237.91 ml   Intake/Output: I/O last 3 completed shifts: In: 13859.8 [I.V.:11379; IV Piggyback:2480.8] Out: 549 [Urine:26; Emesis/NG output:400; Other:123]  Intake/Output this shift:  Total I/O In: 2075.1 [I.V.:1504.9; IV Piggyback:570.2] Out: 85 [Urine:10; Other:75] Weight change: 11.6 kg Gen: intubated/sedated CVS: no rub Resp: cta Abd: obese, hypoactive bowel sounds Ext: trace pretibial/dependent edema  Recent Labs  Lab 12/24/2019 0921 12/31/2019 0931 12/26/2019 1654 01/03/2020 1724 12/29/2019 2130 12/29/2019 2203 01/03/20 0220 01/03/20 0330 01/03/20 0352 01/03/20 0832 01/03/20 0832 01/03/20 1418 01/03/20 1418 01/03/20 1818 01/03/20 1917 01/03/20 2243 Jan 16, 2020 0059 01-16-20 0255 01/16/2020 0435 Jan 16, 2020 1028  NA 129*   < > 147*   < > 146*   < >  --  147*   < > 146*   < > 145   < > 146* 147* 147* 146* 147* 147* 141  K 5.8*   < > 4.1   < > 4.2   < >  --  4.5   < > 4.5   < > 4.6   < > 4.9 4.4 4.6 4.8 4.8 4.4 5.4*  CL 88*   < > 107  --  114*  --   --  113*  --  116*  --  115*  --  113*  --   --   --  109  --   --   CO2 13*   < > 15*  --  12*  --   --  11*  --  11*  --  11*  --  12*  --   --   --  11*  --   --   GLUCOSE 1,719*   < > 829*  --  538*  --   --  365*  --  274*  --  191*  --  168*  --   --   --  121*  --   --   BUN 33*   < > 35*  --  31*  --   --  31*  --  33*  --  33*  --  33*  --   --   --  26*  --   --   CREATININE 3.37*   < > 3.74*  --  3.96*  --   --  4.30*  --  4.35*  --  4.57*  --  4.77*  --   --   --  3.91*   --   --   ALBUMIN 2.5*  --   --   --   --   --   --   --   --   --   --   --   --  1.9*  --   --   --  1.6*  --   --   CALCIUM 10.0   < > 8.6*  --  8.4*  --   --  8.1*  --  7.6*  --  7.2*  --  7.0*  --   --   --  6.3*  --   --   PHOS  --   --   --   --   --   --  4.9*  --   --   --   --   --   --  6.4*  --   --   --  6.5*  --   --   AST 31  --   --   --   --   --   --   --   --   --   --   --   --   --   --   --   --   --   --   --   ALT 23  --   --   --   --   --   --   --   --   --   --   --   --   --   --   --   --   --   --   --    < > = values in this interval not displayed.   Liver Function Tests: Recent Labs  Lab 01/03/2020 0921 01/03/20 1818 January 31, 2020 0255  AST 31  --   --   ALT 23  --   --   ALKPHOS 141*  --   --   BILITOT 1.3*  --   --   PROT 6.8  --   --   ALBUMIN 2.5* 1.9* 1.6*   No results for input(s): LIPASE, AMYLASE in the last 168 hours. No results for input(s): AMMONIA in the last 168 hours. CBC: Recent Labs  Lab 12/22/2019 0921 12/23/2019 0931 01/03/20 0220 01/03/20 0352 01-31-20 0353 01-31-2020 0435 01/31/20 0753 31-Jan-2020 1028  WBC 30.8*  --  38.2*  --  13.5*  --   --   --   HGB 15.2*   < > 13.4   < > 8.8* 7.8*  --  7.8*  HCT 54.3*   < > 43.5   < > 29.1* 23.0*  --  23.0*  MCV 103.6*  --  94.2  --  95.1  --   --   --   PLT 489*   < > 137*  --  47*  --  36*  --    < > = values in this interval not displayed.   Cardiac Enzymes: No results for input(s): CKTOTAL, CKMB, CKMBINDEX, TROPONINI in the last 168 hours. CBG: Recent Labs  Lab 2020/01/31 0631 01-31-2020 0734 2020-01-31 0830 2020/01/31 0931 Jan 31, 2020 1023  GLUCAP 97 83 93 76 73    Iron Studies: No results for input(s): IRON, TIBC, TRANSFERRIN, FERRITIN in the last 72 hours. Studies/Results: DG Chest 1 View  Addendum Date: 01-31-2020   ADDENDUM REPORT: 01-31-2020 08:54 ADDENDUM: In the FINDINGS section, the report states that "the heart remains enlarged." Accounting for technique, the heart is likely normal  in size and is not significantly changed since prior exam. The IMPRESSION remains unchanged. Electronically Signed   By: Zerita Boers M.D.   On: 01-31-2020 08:54   Result Date: 01/31/2020 CLINICAL DATA:  Altered mental status EXAM: CHEST  1 VIEW COMPARISON:  Chest radiograph from the prior day. FINDINGS: An endotracheal tube terminates in the midthoracic trachea. An enteric tube enters the stomach and terminates below the field of view. A right internal jugular central  venous catheter tip overlies the superior vena cava. The heart remains enlarged. The left hemidiaphragm remains obscured which may reflect a pleural effusion with associated atelectasis/airspace disease. There is unchanged mild right basilar atelectasis/airspace disease. There is no right pleural effusion. There is no pneumothorax. IMPRESSION: 1. Persistent left pleural effusion with associated atelectasis/airspace disease. 2. Unchanged mild right basilar atelectasis/airspace disease. Electronically Signed: By: Zerita Boers M.D. On: 01-18-2020 08:19   US RENAL  Result Date: 01/03/2020 CLINICAL DATA:  Acute kidney injury. EXAM: RENAL / URINARY TRACT ULTRASOUND COMPLETE COMPARISON:  Abdominal CT 01/25/2019 at [facility] FINDINGS: Right Kidney: Renal measurements: 10.6 x 4.2 x 4.7 cm = volume: 108 mL. Echogenicity within normal limits. No mass or hydronephrosis visualized. Left Kidney: Not visualized.  ICU patient unable to cooperate with positioning. Bladder: Not visualized. Other: Technically limited exam due to patient body habitus and medical condition. Incidental hepatic steatosis. IMPRESSION: 1. Unremarkable sonographic appearance of the right kidney. No hydronephrosis. 2. Left kidney and urinary bladder not visualized sonographically due to limitations with positioning and body habitus. Electronically Signed   By: Keith Rake M.D.   On: 01/03/2020 03:13   DG Chest Port 1 View  Result Date: 01/03/2020 CLINICAL DATA:  Respiratory  failure EXAM: PORTABLE CHEST 1 VIEW COMPARISON:  01/15/2020 FINDINGS: Endotracheal tube, gastric catheter and right jugular central line are again seen and stable. The overall inspiratory effort is poor. Mild bibasilar atelectatic changes are seen. No bony abnormality is noted. IMPRESSION: Tubes and lines as described above. Stable bibasilar atelectasis. Electronically Signed   By: Inez Catalina M.D.   On: 01/03/2020 08:26   DG Chest Portable 1 View  Result Date: 12/24/2019 CLINICAL DATA:  Central catheter placement.  Hypoxia. EXAM: PORTABLE CHEST 1 VIEW COMPARISON:  January 02, 2020 study obtained earlier in the day FINDINGS: Central catheter tip is in the superior vena cava. Endotracheal tube tip is 2.2 cm above the carina. Nasogastric tube tip and side port are below the diaphragm. No pneumothorax. There is mild bibasilar atelectasis. Lungs elsewhere are clear. Heart size and pulmonary vascularity are normal. No adenopathy. No bone lesions. IMPRESSION: Tube and catheter positions as described without pneumothorax. Bibasilar atelectasis. Lungs elsewhere clear. Stable cardiac silhouette. Electronically Signed   By: Lowella Grip III M.D.   On: 12/28/2019 15:15   DG Abd Portable 1V  Result Date: 01/08/2020 CLINICAL DATA:  44 year old female admitted for unresponsiveness, intubated. Orogastric tube placement. EXAM: PORTABLE ABDOMEN - 1 VIEW COMPARISON:  CT Abdomen and Pelvis 01/25/2019. FINDINGS: AP view of the abdomen at 1720 hours. Enteric tube placed into the stomach. Tip is at the level of the distal gastric body. Side hole at the level of the mid gastric body. Respiratory motion at the lung bases. Negative visible bowel gas pattern and osseous structures. IMPRESSION: Enteric tube placed into the stomach with side hole at the gastric body. Electronically Signed   By: Genevie Ann M.D.   On: 12/28/2019 17:36   ECHOCARDIOGRAM LIMITED  Result Date: 01-18-2020    ECHOCARDIOGRAM LIMITED REPORT   Patient  Name:   Danielle Moreno Date of Exam: 01/18/20 Medical Rec #:  [id number]           Height:       68.0 in Accession #:    [id number]          Weight:       274.5 lb Date of Birth:  September 16, 1976  BSA:          2.34 m Patient Age:    11 years            BP:           114/51 mmHg Patient Gender: F                   HR:           84 bpm. Exam Location:  Inpatient Procedure: Limited Echo, Cardiac Doppler and Color Doppler STAT ECHO Indications:    Cardiomyopathy-Unspecified 425.9  History:        Patient has no prior history of Echocardiogram examinations.                 Risk Factors:Diabetes and Current Smoker.  Sonographer:    Vickie Epley RDCS Referring Phys: [id number] [place]  1. Limited echo. Mildly reduced LV EF without focal wall motion abnormalities. RV not well seen but wall appears at least mildly thickened. No hemodynamically significant valve disease.  2. Left ventricular ejection fraction, by estimation, is 45 to 50%. The left ventricle has mildly decreased function. The left ventricle demonstrates global hypokinesis. Left ventricular diastolic function could not be evaluated.  3. Right ventricular systolic function was not well visualized. The right ventricular size is not well visualized. mildly increased right ventricular wall thickness. There is normal pulmonary artery systolic pressure.  4. Moderate pleural effusion in both left and right lateral regions.  5. The mitral valve is normal in structure and function. Trivial mitral valve regurgitation. No evidence of mitral stenosis.  6. The aortic valve is tricuspid. Aortic valve regurgitation is not visualized. No aortic stenosis is present. FINDINGS  Left Ventricle: Left ventricular ejection fraction, by estimation, is 45 to 50%. The left ventricle has mildly decreased function. The left ventricle demonstrates global hypokinesis. There is no left ventricular hypertrophy. Right Ventricle: The right ventricular size is not well  visualized. Mildly increased right ventricular wall thickness. Right ventricular systolic function was not well visualized. There is normal pulmonary artery systolic pressure. The tricuspid regurgitant velocity is 2.19 m/s, and with an assumed right atrial pressure of 8 mmHg, the estimated right ventricular systolic pressure is 88.5 mmHg. Left Atrium: Left atrial size was normal in size. Right Atrium: Right atrial size was normal in size. Pericardium: Trivial pericardial effusion is present. Mitral Valve: The mitral valve is normal in structure and function. Trivial mitral valve regurgitation. No evidence of mitral valve stenosis. Tricuspid Valve: The tricuspid valve is normal in structure. Tricuspid valve regurgitation is trivial. No evidence of tricuspid stenosis. Aortic Valve: The aortic valve is tricuspid. Aortic valve regurgitation is not visualized. No aortic stenosis is present. Pulmonic Valve: The pulmonic valve was grossly normal. Pulmonic valve regurgitation is not visualized. No evidence of pulmonic stenosis. Aorta: The aortic root is normal in size and structure. Venous: The inferior vena cava was not well visualized. IAS/Shunts: The atrial septum is grossly normal. Additional Comments: There is a moderate pleural effusion in both left and right lateral regions.  LEFT VENTRICLE PLAX 2D LVIDd:         3.27 cm LVIDs:         2.47 cm LV PW:         0.99 cm LV IVS:        0.96 cm LV SV Index:   8.59  LV Volumes (MOD) LV vol d, MOD A2C: 104.0 ml LV vol d, MOD A4C: 113.0 ml  LV vol s, MOD A2C: 57.6 ml LV vol s, MOD A4C: 67.6 ml LV SV MOD A2C:     46.4 ml LV SV MOD A4C:     113.0 ml LV SV MOD BP:      45.1 ml LEFT ATRIUM         Index LA diam:    3.40 cm 1.45 cm/m   AORTA Ao Root diam: 2.40 cm TRICUSPID VALVE TR Peak grad:   19.2 mmHg TR Vmax:        219.00 cm/s Buford Dresser MD Electronically signed by Buford Dresser MD Signature Date/Time: 10-Jan-2020/8:36:08 AM    Final    . chlorhexidine  gluconate (MEDLINE KIT)  15 mL Mouth Rinse BID  . Chlorhexidine Gluconate Cloth  6 each Topical Q0600  . famotidine  20 mg Per Tube Daily  . fentaNYL (SUBLIMAZE) injection  50 mcg Intravenous Once  . hydrocortisone sod succinate (SOLU-CORTEF) inj  50 mg Intravenous Q6H  . mouth rinse  15 mL Mouth Rinse 10 times per day  . vancomycin variable dose per unstable renal function (pharmacist dosing)   Does not apply See admin instructions    BMET    Component Value Date/Time   NA 141 January 10, 2020 1028   NA 140 11/22/2018 1643   K 5.4 (H) 2020/01/10 1028   CL 109 2020/01/10 0255   CO2 11 (L) 2020-01-10 0255   GLUCOSE 121 (H) 01/10/2020 0255   BUN 26 (H) 2020/01/10 0255   BUN 16 11/22/2018 1643   CREATININE 3.91 (H) 01-10-2020 0255   CALCIUM 6.3 (LL) 01/10/20 0255   GFRNONAA 13 (L) 01/10/2020 0255   GFRAA 15 (L) 01/10/2020 0255   CBC    Component Value Date/Time   WBC 13.5 (H) Jan 10, 2020 0353   RBC 3.06 (L) 01-10-20 0353   HGB 7.8 (L) Jan 10, 2020 1028   HCT 23.0 (L) 01-10-20 1028   PLT 36 (L) 10-Jan-2020 0753   MCV 95.1 January 10, 2020 0353   MCH 28.8 01-10-2020 0353   MCHC 30.2 Jan 10, 2020 0353   RDW 15.4 01-10-2020 0353   LYMPHSABS 1.5 01/17/2019 1741   MONOABS 0.5 01/17/2019 1741   EOSABS 0.0 01/17/2019 1741   BASOSABS 0.0 01/17/2019 1741   Assessment/Plan: 1.  Oliguric AKI- in setting of DKA and septic shock.  Not responding to IVF's and requiring high dose pressors for BP management.   1. Initiated CVVHD in the evening of 01/03/20 due to progressive worsening of her acidosis and hypotension. 2. Will change replacement fluids to isotonic bicarb given persistent acidosis. 2. Metabolic acidosis due to #1 and metformin use.  Not improving with control of DKA.   1. On CVVHD and will use isotonic bicarb as both replacement fluids 3. DKA- on insulin drip 4. VDRF- per PCCM 5. Septic shock- unclear etiology.  Cultures pending.  Possible urosepsis with chronic foley catheter.    1. Currently on multiple pressors. 2. Given Vanco, cefepime, flagyl, and zosyn per PCCM 3. Cultures negative to date 6. Acute encephalopathy- presumably due to DKA but also on baclofen and opioids at home. 7. Urinary retention- due to cauda equina syndrome- has chronic foley.  Renal US unable to visualize bladder or left kidney due to inability to change position.  Consider ct scan when stable to r/o obstruction/abscess/stone 8. Hypernatremia- improving with 1/2 NS 9. Cauda equina syndrome  Donetta Potts, MD [facility] [phone]

## 2020-01-16 NOTE — Procedures (Signed)
Turned patient to get in position for pelvic exam (shock unknown origin, c/o IUD pain recently).  Patient lost pulses and so we did chest compressions, epi, atropine, bicarb, pulse returned.  With good Bps, proceeded with quick pelvic exam (female chaperone present, consent obtained from husband), cervix visualized with no purulent output, vaginal vault with yeast present. Due to patient instability and low platelets, did not swab cervix.  Myrla Halsted MD PCCM

## 2020-01-16 NOTE — Code Documentation (Cosign Needed)
Called to code in progress. Staff report patient noted to have frequent ectopy, PVC's then to PEA.  CPR initiated with ACLS protocol.  Patient required multiple rounds of epinephrine, bicarbonate.  Epinephrine gtt started.  ROSC achieved after ~ 7 minutes of CPR.  Family updated at bedside.  Support offered to family.  Chaplain at bedside.    Canary Brim, MSN, NP-C [facility] 01/10/2020, 2:59 PM   Please see [email or web address] for pager details.

## 2020-01-16 NOTE — Progress Notes (Signed)
eLink Physician-Brief Progress Note Patient Name: Danielle Moreno DOB: 01/19/1976 MRN: [id number]   Date of Service  01/09/2020  HPI/Events of Note  PH 7.19   eICU Interventions  Sodium bicarbonate 50 meq iv bolus x 1        [name] U [name] 12/27/2019, 1:06 AM

## 2020-01-16 NOTE — Plan of Care (Signed)
  Problem: Elimination: Goal: Will not experience complications related to bowel motility Outcome: Progressing   Problem: Clinical Measurements: Goal: Diagnostic test results will improve Outcome: Not Progressing Note: Pt labs worsened overnight requiring CRRT.  Goal: Respiratory complications will improve Outcome: Not Progressing Note: Pt oxygen demands increased from 40% to 60% FiO2 overnight Goal: Cardiovascular complication will be avoided Outcome: Not Progressing Note: Pt is currently on the maximum amount of pressors.   Problem: Activity: Goal: Risk for activity intolerance will decrease Outcome: Not Progressing Note: Pt unresponsive and thus bed bound at this time   Problem: Nutrition: Goal: Adequate nutrition will be maintained Outcome: Not Progressing Note: Due to patient still being on insulin gtt and anion gap not closing, unable to start tube feeds at this time. Will reevaluate with MD and Dietary today.   Problem: Activity: Goal: Ability to tolerate increased activity will improve Outcome: Not Progressing

## 2020-01-16 NOTE — Progress Notes (Signed)
Patient arrested multiple more times through day. Despite high quality chest compressions, bicarb pushes, and multiple pressors, patient was unable to be revived.  Family at bedside the entire code. Time of death. 16:05. Myrla Halsted MD PCCM

## 2020-01-16 NOTE — Discharge Summary (Signed)
Date of Admission: 18-Jan-2020 Date of Death: 20-Jan-2020 Diagnoses:  -Multiorgan failure from DKA and lactic acidosis, presumed septic shock in origin -Cauda equina syndrome -Prior VTE on [facility] Course: Patient was admitted for encephalopathy, profound hyperglycemia and acidemia, primary thought to be DKA at first.  Despite early intubation, broad spectrum antibiotics, aggressive fluid resuscitation and bicarbonate repletion, CRRT, patient progressed to worsening shock and was unable to clear her lactic acid.  She passed away after multiple rounds of CPR with family at bedside.  Myrla Halsted MD PCCM

## 2020-01-16 NOTE — Progress Notes (Signed)
  Echocardiogram 2D Echocardiogram has been performed.  Danielle Moreno 01/11/2020, 8:26 AM

## 2020-01-16 NOTE — Progress Notes (Addendum)
eLink Physician-Brief Progress Note Patient Name: Danielle Moreno DOB: May 28, 1976 MRN: [id number]   Date of Service  January 07, 2020  HPI/Events of Note  Hypotension with MAPs in the 50's. Last PH was 7.19. Pt maxed out on 3 pressors.  eICU Interventions  Sodium bicarbonate 2 amps iv bolus x 1, Albumin 5 % 250 ml iv bolus x 1. ABG in 2 hours.        [name] [name] 07-Jan-2020, 3:06 AM

## 2020-01-16 NOTE — Progress Notes (Signed)
Care of family transferred from fellow Chaplain at the time the patient expired. [employer] of presence.  Joined in prayer at bedside with patient's husband and father.  All family has left the hospital while husband remains at bedside.  Vernell Morgans Chaplain Resident

## 2020-01-16 NOTE — Progress Notes (Signed)
Responded to code blue. Providing care to husband and rest of the family. Will update unit chaplain and continue to provide spiritual care and a ministry of presence.   Rev. Margaretann Loveless Chaplain M. Div.

## 2020-01-16 NOTE — Progress Notes (Signed)
Husband updated, suspect death within hours despite aggressive management.

## 2020-01-16 NOTE — Progress Notes (Signed)
RN discussed patients medical record with Medical Examiner Asencion Partridge. After reviewing patients record, he said that patient is not a medical examiner case.

## 2020-01-16 NOTE — Progress Notes (Signed)
eLink Physician-Brief Progress Note Patient Name: Danielle Moreno DOB: 04/23/1976 MRN: [id number]   Date of Service  01/11/2020  HPI/Events of Note  Blood pressure continues to be soft intermittently.  eICU Interventions  Vasopressin increased to 0.04.        Danielle Moreno 12/20/2019, 4:28 AM

## 2020-01-16 NOTE — Progress Notes (Addendum)
NAME:  Danielle Moreno, MRN:  [id number], DOB:  1976-10-06, LOS: 2 ADMISSION DATE:  2020-01-16,  REFERRING MD: Dr. Fredderick Phenix, ED,  CHIEF COMPLAINT: DKA, acute respiratory failure  Brief History   44 year old woman, cauda equina syndrome, chronic Foley catheter, remote DVT on Eliquis, diabetes.  Admitted with DKA, encephalopathy, intubated for airway protection.  History of present illness   44 year old woman with a history of cauda equina syndrome, Herniated disc disease post surgical decompression 03/2019 and for which she has a chronic Foley catheter, mild diabetes managed on metformin, hyperlipidemia.  Reportedly had a DVT after her spine surgery last year, on chronic Eliquis.  She has experienced 4 to 5 days of poor p.o. intake, extreme thirst nausea.  She was found by her husband to be unresponsive 2/15, snoring respirations.  EMS activated, found to be hyperglycemic.  In the ED unresponsive and ET intubated for airway protection. She developed hypotension post induction and intubation.  Lab evaluation consistent with DKA, uncontrolled diabetes, acute renal failure, severe anion gap metabolic acidosis, hyperkalemia.  No clear infectious prodrome or precipitant of her decompensation.  She received empiric antibiotics x1 in the emergency department.  Past Medical History   Past Medical History:  Diagnosis Date  . Cauda equina compression (HCC)   . Diabetes mellitus without complication (HCC)   . Indwelling urinary catheter fell out [facility])   Cauda equina syndrome, herniated disc disease status post surgery 03/2019 DVT in the setting of her surgery 03/2019, on Eliquis Bladder dysfunction due to her cauda equina syndrome, chronic Foley catheter Hyperlipidemia   Significant Hospital Events     Consults:    Procedures:  ETT 2/15 >>  CVC 2/15 >>   Significant Diagnostic Tests:  Head CT 2/15 >> no acute findings to explain encephalopathy Chest x-ray 2/15 >> no infiltrates, ET tube in good  position, bibasilar atelectasis  Micro Data:  Blood 2/15 >>  Respiratory 2/15 >> Urine 2/15 >>   Antimicrobials:  Vancomycin x1 2/15 Cefepime x1 2/15 Flagyl x1 2/15  Interim history/subjective:  Remains profoundly acidotic in multiorgan failure on 3 pressors.  Objective   Blood pressure (!) 114/51, pulse 83, temperature (!) 94.6 F (34.8 C), resp. rate (!) 34, height 5\' 8"  (1.727 m), weight 124.5 kg, SpO2 100 %. CVP:  [5 mmHg-18 mmHg] 18 mmHg  Vent Mode: PRVC FiO2 (%):  [40 %-60 %] 60 % Set Rate:  [26 bmp-30 bmp] 30 bmp Vt Set:  [510 mL] 510 mL PEEP:  [5 cmH20] 5 cmH20 Plateau Pressure:  [19 cmH20-20 cmH20] 20 cmH20   Intake/Output Summary (Last 24 hours) at 01/13/2020 01/06/2020 Last data filed at 12/23/2019 0700 Gross per 24 hour  Intake 10715.76 ml  Output 543 ml  Net 10172.76 ml   Filed Weights   01-16-20 1034 01/03/20 0500 01/15/2020 0456  Weight: 112.9 kg 123 kg 124.5 kg    Examination: GEN: unresponsive woman on vent HEENT: ETT in place, minimal secretions CV: RRR, ext lukewarm, toes dusky PULM: Diminished, no wheezing, triggering vent GI: Soft, hypoactive BS EXT: no edema NEURO: grimaces to pain, left gaze preference PSYCH: cannot evaluate SKIN: no rashes   Resolved Hospital Problem list     Assessment & Plan:  Acute respiratory failure requiring mechanical ventilation due to encephalopathy and inadequate airway protection -Liberalize TV to allow better vent synchrony and compensate acidosis -VAP prevention bundle -Do not SBT at this time -Sedation PRN  Mixed profound acidosis- lactic, DKA, crystalloid-associated - Remains on bicarb drip, bicarb  pushes, and CRRT - Continue to push insulin drip - Check and trend beta hydroxybutyrate, lactic acid to try to get idea of relative contributions each  Shock.  Profound, septic and metabolic - Check limited echo - Albumin - Pressors as ordered - Start stress steroids, push insulin PRN - Broad spectrum abx as  ordered, f/u culture data  Acute renal failure, presumed hypovolemia possible contribution of her DM.   - CRRT  Cauda equina syndrome, chronic Foley catheter -Changed 2/15 -Urine culture -Hold baclofen, Flexeril oxybutynin  Baseline hypertension and hyperlipidemia -Hold propranolol and statin for now  History DVT May 2020, thrombocytopenia -Hold AC at this time given rapidly dropping platelets -DIC panel  Profound metabolic encephalopathy- CT head benign, supportive care  Multiorgan failure- very poor prognosis, unclear at this time what is driving everything but she is in serious trouble  Best practice:  Diet: NPO Pain/Anxiety/Delirium protocol (if indicated): hold all, comatose VAP protocol (if indicated): Ordered DVT prophylaxis: SCDs GI prophylaxis: Pepcid Glucose control: Insulin infusion Mobility: BR Code Status: Full Family Communication: Husband coming in later, will discuss, may not live through day Disposition: ICU  Labs   CBC: Recent Labs  Lab 01/05/2020 0921 01/06/2020 0931 01/03/20 0220 01/03/20 0352 01/03/20 1917 01/03/20 2243 2020-01-16 0059 January 16, 2020 0353 01-16-2020 0435  WBC 30.8*  --  38.2*  --   --   --   --  13.5*  --   HGB 15.2*   < > 13.4   < > 10.2* 9.9* 9.2* 8.8* 7.8*  HCT 54.3*   < > 43.5   < > 30.0* 29.0* 27.0* 29.1* 23.0*  MCV 103.6*  --  94.2  --   --   --   --  95.1  --   PLT 489*  --  137*  --   --   --   --  47*  --    < > = values in this interval not displayed.    Basic Metabolic Panel: Recent Labs  Lab 01/07/2020 2130 01/03/20 0220 01/03/20 0330 01/03/20 0352 01/03/20 0832 01/03/20 0832 01/03/20 1418 01/03/20 1418 01/03/20 1818 01/03/20 1818 01/03/20 1917 01/03/20 2243 01/16/20 0059 Jan 16, 2020 0255 January 16, 2020 0435  NA   < >  --  147*   < > 146*   < > 145   < > 146*   < > 147* 147* 146* 147* 147*  K   < >  --  4.5   < > 4.5   < > 4.6   < > 4.9   < > 4.4 4.6 4.8 4.8 4.4  CL   < >  --  113*  --  116*  --  115*  --  113*  --    --   --   --  109  --   CO2   < >  --  11*  --  11*  --  11*  --  12*  --   --   --   --  11*  --   GLUCOSE   < >  --  365*  --  274*  --  191*  --  168*  --   --   --   --  121*  --   BUN   < >  --  31*  --  33*  --  33*  --  33*  --   --   --   --  26*  --   CREATININE   < >  --  4.30*  --  4.35*  --  4.57*  --  4.77*  --   --   --   --  3.91*  --   CALCIUM   < >  --  8.1*  --  7.6*  --  7.2*  --  7.0*  --   --   --   --  6.3*  --   MG  --  1.9  --   --   --   --   --   --   --   --   --   --   --  1.8  --   PHOS  --  4.9*  --   --   --   --   --   --  6.4*  --   --   --   --  6.5*  --    < > = values in this interval not displayed.   GFR: Estimated Creatinine Clearance: 25 mL/min (A) (by C-G formula based on SCr of 3.91 mg/dL (H)). Recent Labs  Lab 2020-01-21 0921 January 21, 2020 1125 01-21-20 2130 01/03/20 0220 01/03/20 0827 01/05/2020 0255 01/03/2020 0353  PROCALCITON  --   --   --   --  45.07 13.99  --   WBC 30.8*  --   --  38.2*  --   --  13.5*  LATICACIDVEN  --  >11.0* 9.3*  --   --   --   --     Liver Function Tests: Recent Labs  Lab 01/21/20 0921 01/03/20 1818 12/25/2019 0255  AST 31  --   --   ALT 23  --   --   ALKPHOS 141*  --   --   BILITOT 1.3*  --   --   PROT 6.8  --   --   ALBUMIN 2.5* 1.9* 1.6*   No results for input(s): LIPASE, AMYLASE in the last 168 hours. No results for input(s): AMMONIA in the last 168 hours.  ABG    Component Value Date/Time   PHART 7.269 (L) 12/28/2019 0435   PCO2ART 27.0 (L) 12/21/2019 0435   PO2ART 62.0 (L) 01/03/2020 0435   HCO3 12.7 (L) 12/31/2019 0435   TCO2 14 (L) 12/25/2019 0435   ACIDBASEDEF 13.0 (H) 12/30/2019 0435   O2SAT 92.0 01/12/2020 0435     Coagulation Profile: No results for input(s): INR, PROTIME in the last 168 hours.  Cardiac Enzymes: No results for input(s): CKTOTAL, CKMB, CKMBINDEX, TROPONINI in the last 168 hours.  HbA1C: Hemoglobin A1C  Date/Time Value Ref Range Status  11/22/2018 03:17 PM 5.4 4.0 - 5.6 %  Final   HbA1c, POC (prediabetic range)  Date/Time Value Ref Range Status  10/20/2018 03:16 PM 5.8 5.7 - 6.4 % Final    CBG: Recent Labs  Lab 12/24/2019 0258 01/05/2020 0402 12/20/2019 0450 01/15/2020 0545 01/10/2020 0631  GLUCAP 126* 102* 113* 100* 97    Review of Systems:   Unable to obtain  Past Medical History  She,  has a past medical history of Cauda equina compression (HCC), Diabetes mellitus without complication (HCC), and Indwelling urinary catheter fell out (HCC).   Surgical History    Past Surgical History:  Procedure Laterality Date  . LUMBAR LAMINECTOMY/DECOMPRESSION MICRODISCECTOMY Right 01/17/2019   Procedure: RIGHT LUMBAR FOUR-FIVE MICRODISCECTOMY;  Surgeon: Julio Sicks, MD;  Location: [facility] OR;  Service: Neurosurgery;  Laterality: Right;  . NO PAST SURGERIES       Social History   reports that  she has been smoking cigarettes. She has never used smokeless tobacco. She reports that she does not drink alcohol or use drugs.   Family History   Her family history includes Diabetes in her mother; Healthy in her father.   Allergies No Known Allergies   Home Medications  Prior to Admission medications   Medication Sig Start Date End Date Taking? Authorizing Provider  baclofen (LIORESAL) 10 MG tablet Take 10 mg by mouth 3 (three) times daily as needed for muscle spasms. 11/15/19   [provider]  cyclobenzaprine (FLEXERIL) 10 MG tablet Take 1 tablet (10 mg total) by mouth 3 (three) times daily as needed for muscle spasms. 01/20/19   Earnie Larsson, MD  DULoxetine (CYMBALTA) 30 MG capsule Take 30-60 mg by mouth daily. 11/15/19   [provider]  ELIQUIS 5 MG TABS tablet Take 5 mg by mouth 2 (two) times daily. 11/03/19   [provider]  FERREX 150 150 MG capsule Take 150 mg by mouth daily. 11/03/19   [provider]  gabapentin (NEURONTIN) 300 MG capsule Take 600 mg by mouth in the morning, at noon, and at bedtime. 12/05/19   [provider]  HYDROcodone-acetaminophen (NORCO/VICODIN) 5-325 MG tablet Take 1 tablet by mouth every 4 (four) hours as needed for moderate pain ((score 4 to 6)). 01/20/19   Earnie Larsson, MD  metFORMIN (GLUCOPHAGE) 500 MG tablet Take 1 tablet (500 mg total) by mouth 2 (two) times daily with a meal. 10/20/18   McClung, Dionne Bucy, PA-C  nortriptyline (PAMELOR) 25 MG capsule Take 25-75 mg by mouth at bedtime as needed for sleep. 11/15/19   [provider]  oxybutynin (DITROPAN-XL) 10 MG 24 hr tablet Take 10 mg by mouth daily. 11/09/19   [provider]  oxyCODONE-acetaminophen (PERCOCET) 10-325 MG tablet Take 1-2 tablets by mouth every 4 (four) hours as needed for severe pain. 11/15/19   [provider]  pravastatin (PRAVACHOL) 40 MG tablet Take 40 mg by mouth daily. 12/02/19   [provider]  propranolol (INDERAL) 10 MG tablet Take 10 mg by mouth 2 (two) times daily. 12/05/19   [provider]  rosuvastatin (CRESTOR) 10 MG tablet Take 1 tablet (10 mg total) by mouth daily. To lower cholesterol Patient not taking: Reported on 01/17/2019 12/09/18   Antony Blackbird, MD     Critical care time: 50 min     Erskine Emery MD [facility] 12/29/2019 7:35 AM Personal pager: [phone] If unanswered, please page CCM On-call: [phone]

## 2020-01-16 NOTE — Progress Notes (Signed)
eLink Physician-Brief Progress Note Patient Name: Danielle Moreno DOB: 11-13-1976 MRN: [id number]   Date of Service  2020-01-19  HPI/Events of Note  Serum albumin 1.5, blood pressure drifting down slowly, last PH 7.26  eICU Interventions  Albumin 25 %  25 gm iv x 1, sodium bicarbonate 1 gm iv x 1.        [name] [name] January 19, 2020, 5:17 AM

## 2020-01-16 DEATH — deceased

## 2020-07-31 IMAGING — US US RENAL
1 series · 14 of 20 positions shown · non-contrast
Comparison: Abdominal CT 01/25/2019 at [HOSPITAL]

CLINICAL DATA: Acute kidney injury.

EXAM:
RENAL / URINARY TRACT ULTRASOUND COMPLETE

[Series 1: us renal · 20 acquisitions, 14 frames shown]
[im 1/20]
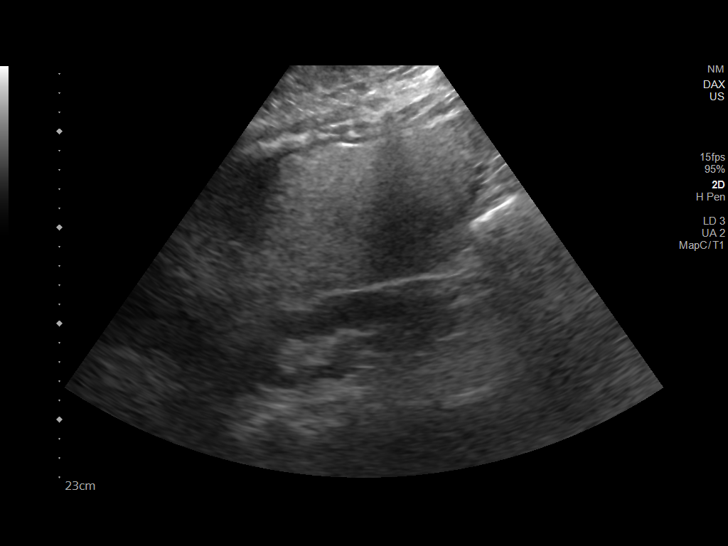
[im 3/20]
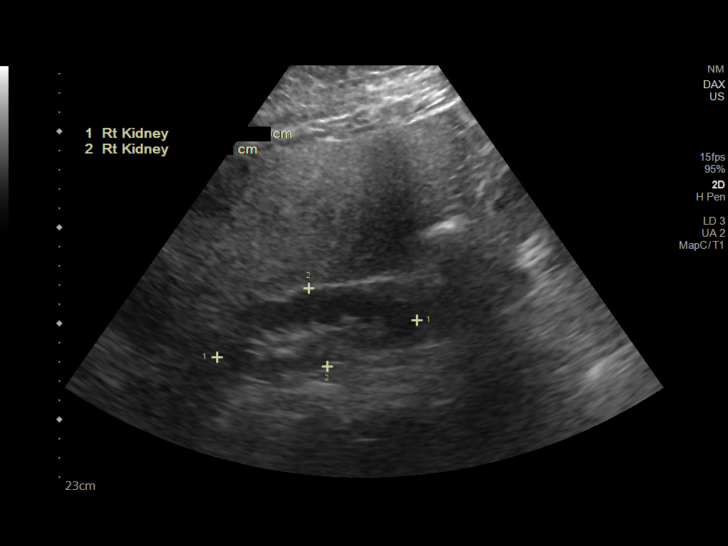
[im 4/20]
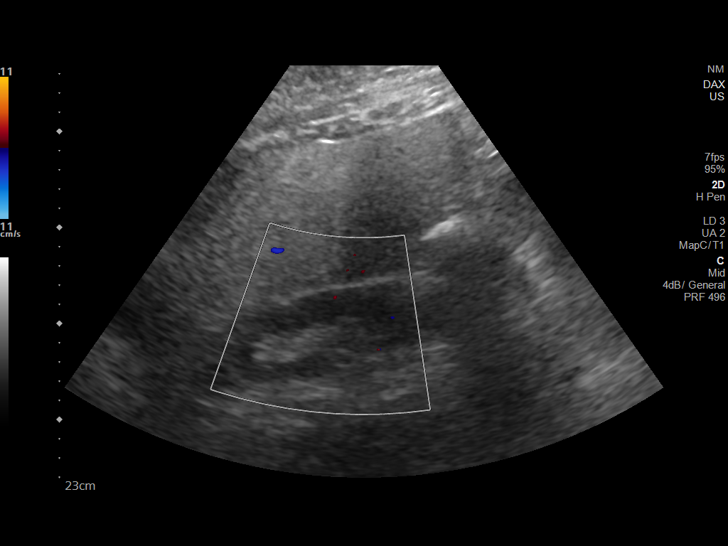
[im 6/20]
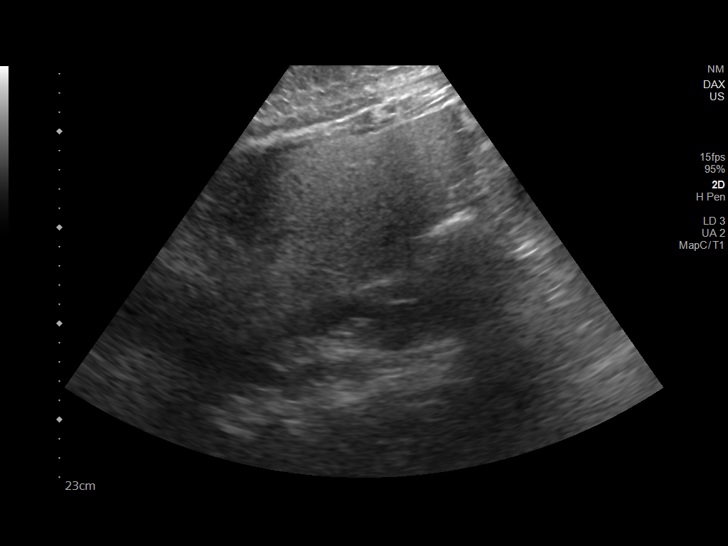
[im 7/20]
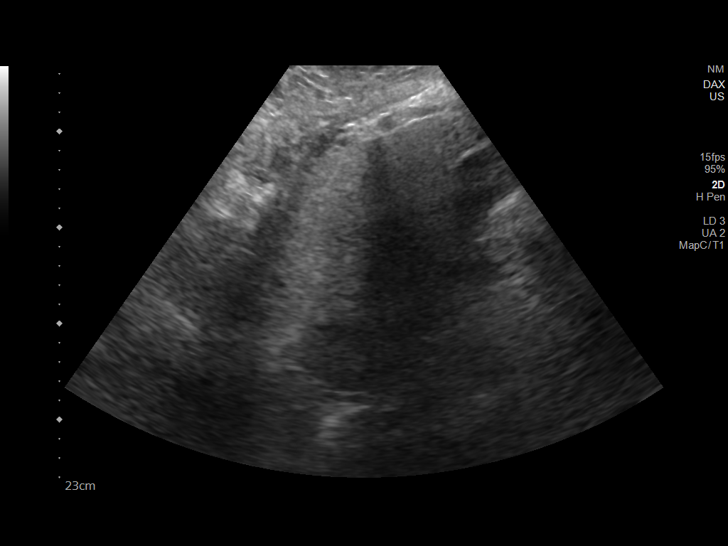
[im 8/20]
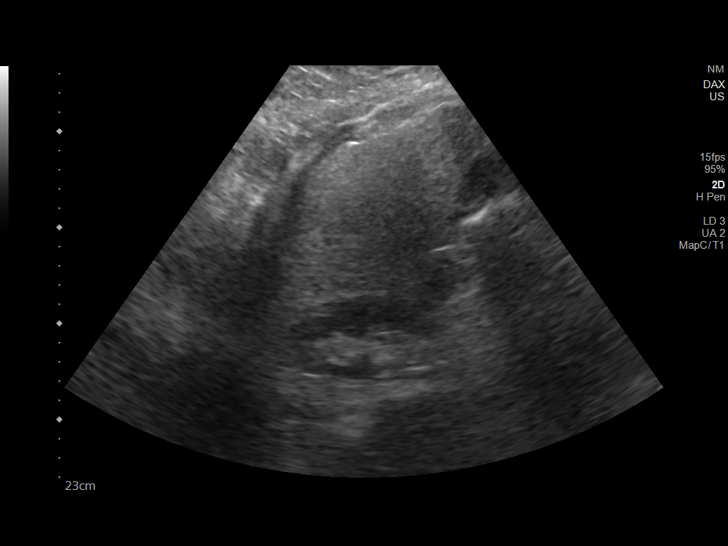
[im 10/20]
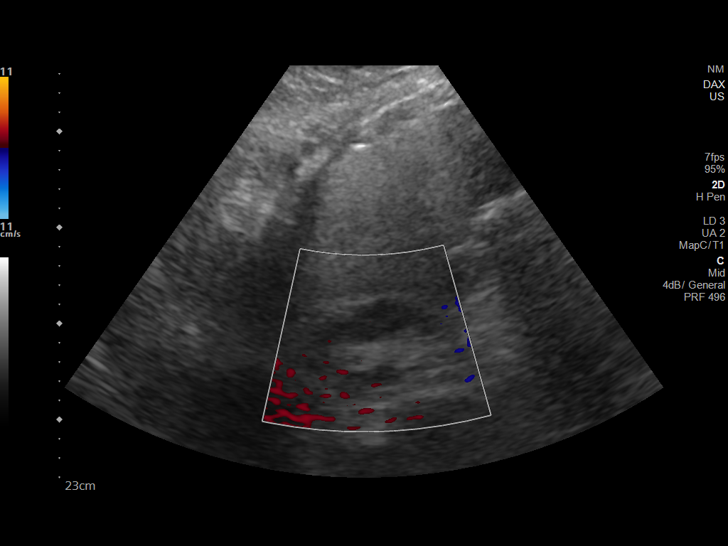
[im 11/20]
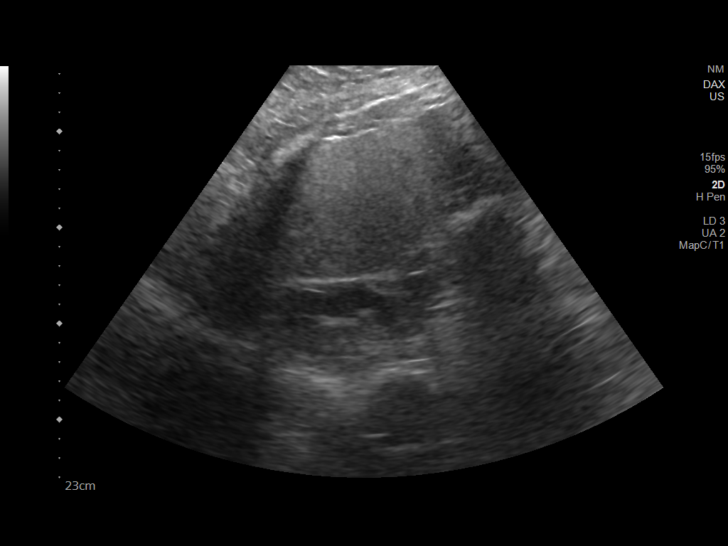
[im 13/20]
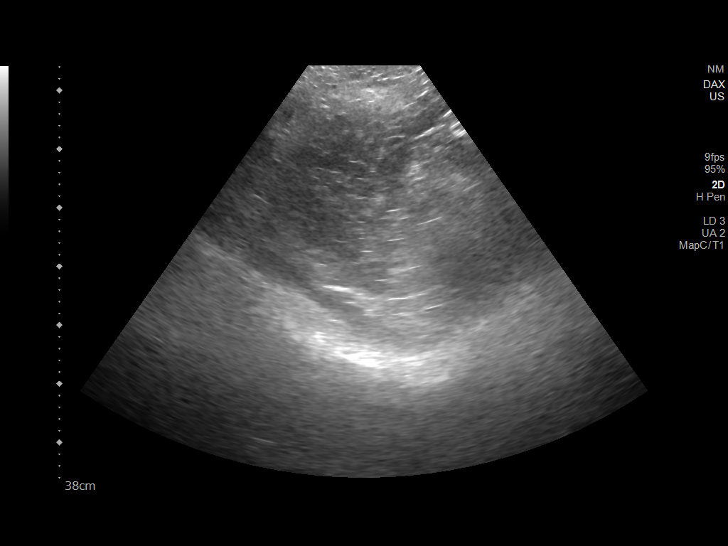
[im 14/20]
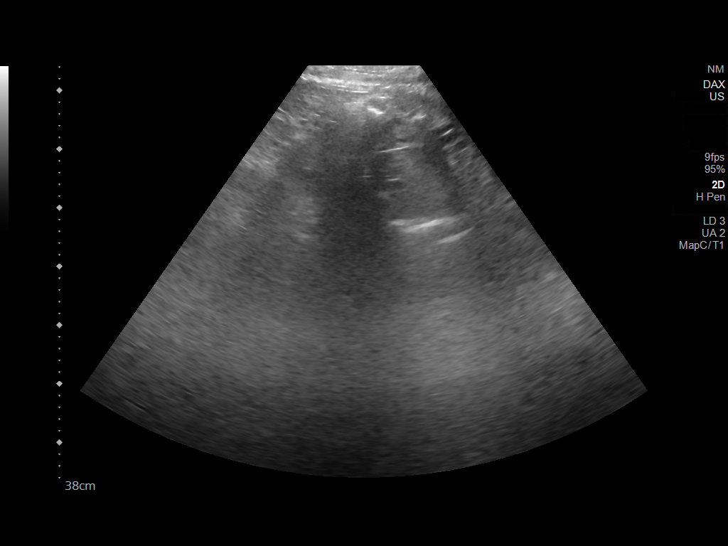
[im 16/20]
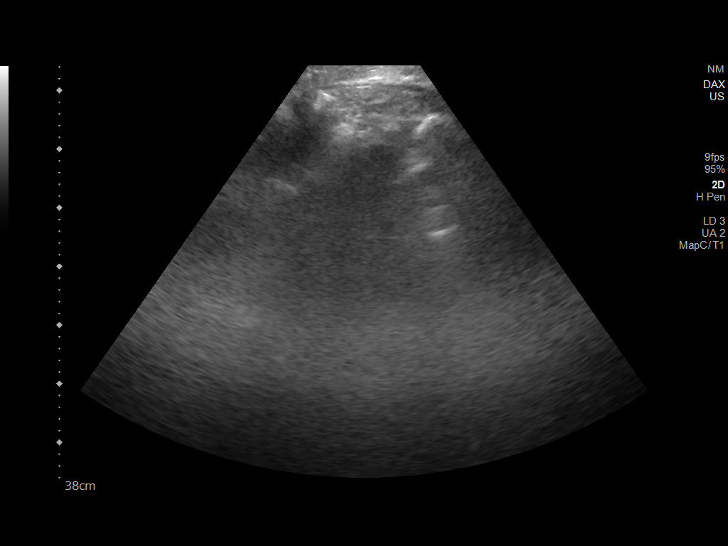
[im 17/20]
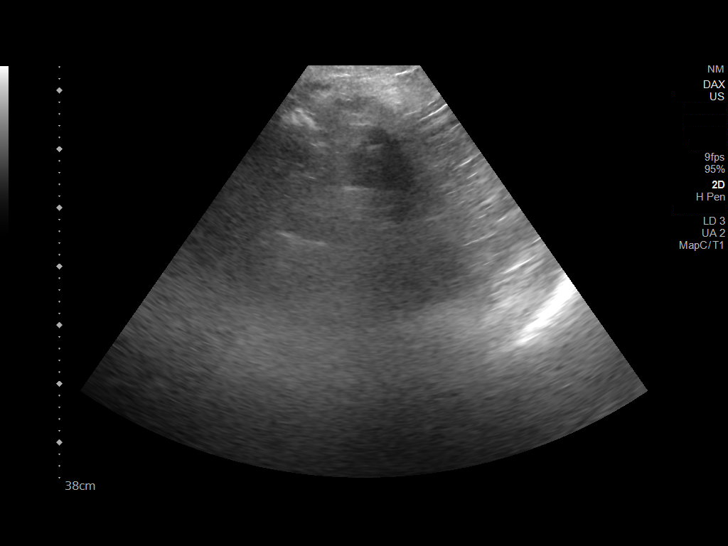
[im 18/20]
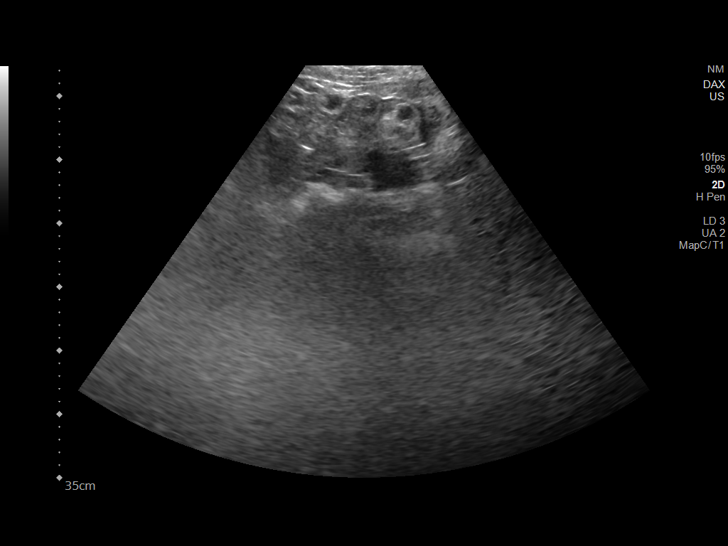
[im 20/20]
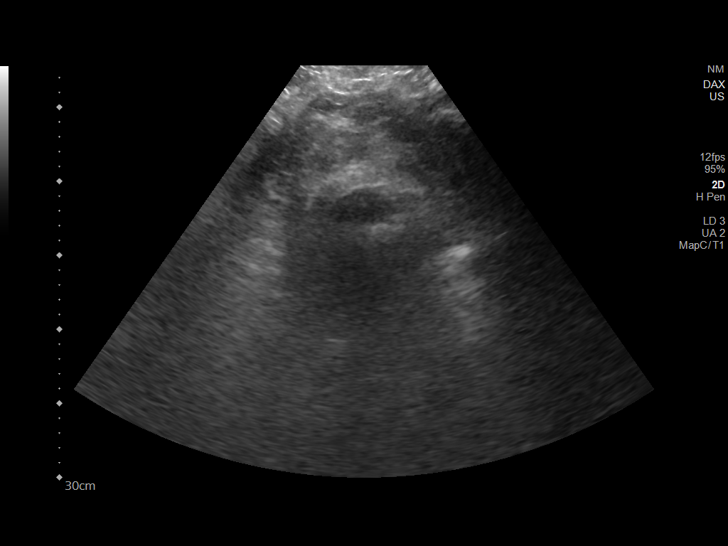

[14 of 20 positions shown; findings below may reference images not displayed]

FINDINGS: Right Kidney:

Renal measurements: 10.6 x 4.2 x 4.7 cm = volume: 108 mL.
Echogenicity within normal limits. No mass or hydronephrosis
visualized.

Left Kidney:

Not visualized.  ICU patient unable to cooperate with positioning.

Bladder:

Not visualized.

Other:

Technically limited exam due to patient body habitus and medical
condition. Incidental hepatic steatosis.
IMPRESSION: 1. Unremarkable sonographic appearance of the right kidney. No
hydronephrosis.
2. Left kidney and urinary bladder not visualized sonographically
due to limitations with positioning and body habitus.

## 2020-07-31 IMAGING — DX DG CHEST 1V PORT
1 series · 1 of 1 positions shown · non-contrast
Comparison: 01/02/2020

CLINICAL DATA: Respiratory failure

EXAM:
PORTABLE CHEST 1 VIEW

[chest]
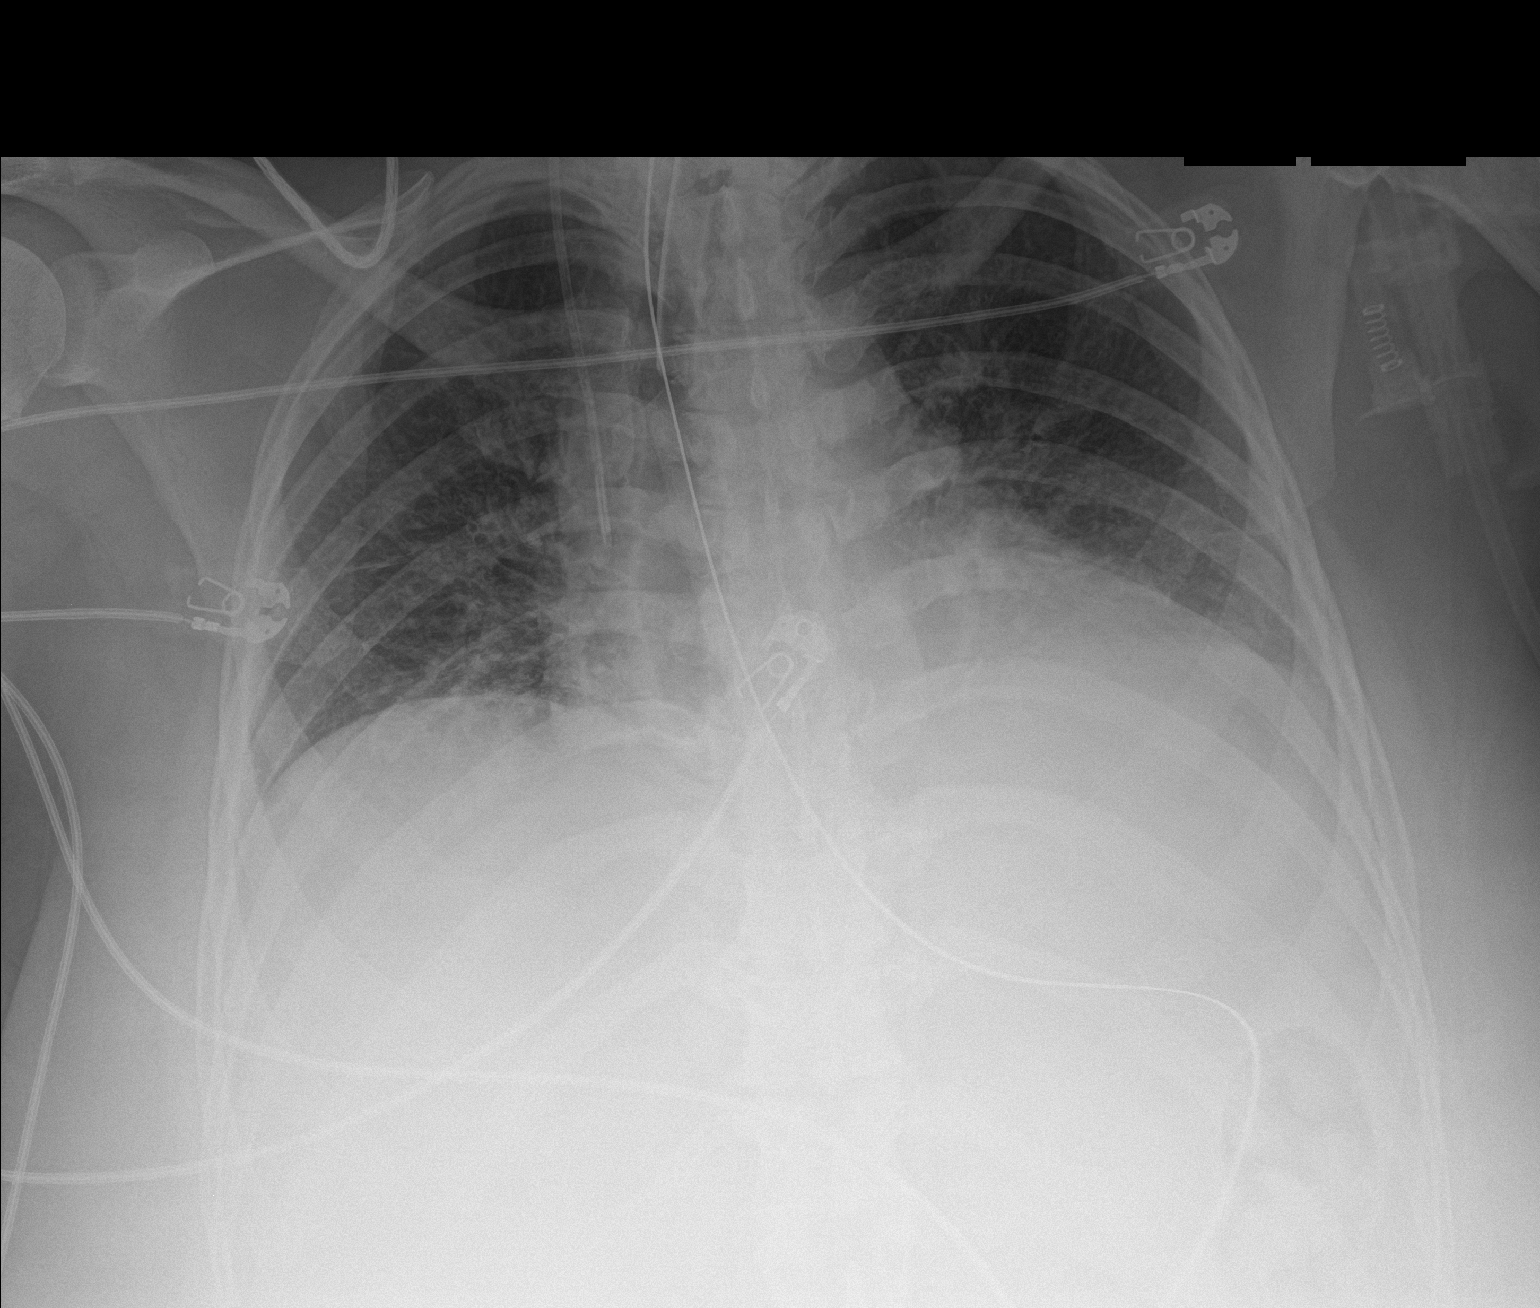

[1 of 1 positions shown; findings below may reference images not displayed]

FINDINGS: Endotracheal tube, gastric catheter and right jugular central line
are again seen and stable. The overall inspiratory effort is poor.
Mild bibasilar atelectatic changes are seen. No bony abnormality is
noted.
IMPRESSION: Tubes and lines as described above.

Stable bibasilar atelectasis.

## 2020-08-01 IMAGING — DX DG CHEST 1V
1 series · 1 of 1 positions shown · non-contrast
Comparison: Chest radiograph from the prior day.
COMPARISON: Chest radiograph from the prior day.

Addendum:
CLINICAL DATA: Altered mental status

EXAM:
CHEST  1 VIEW

[chest]
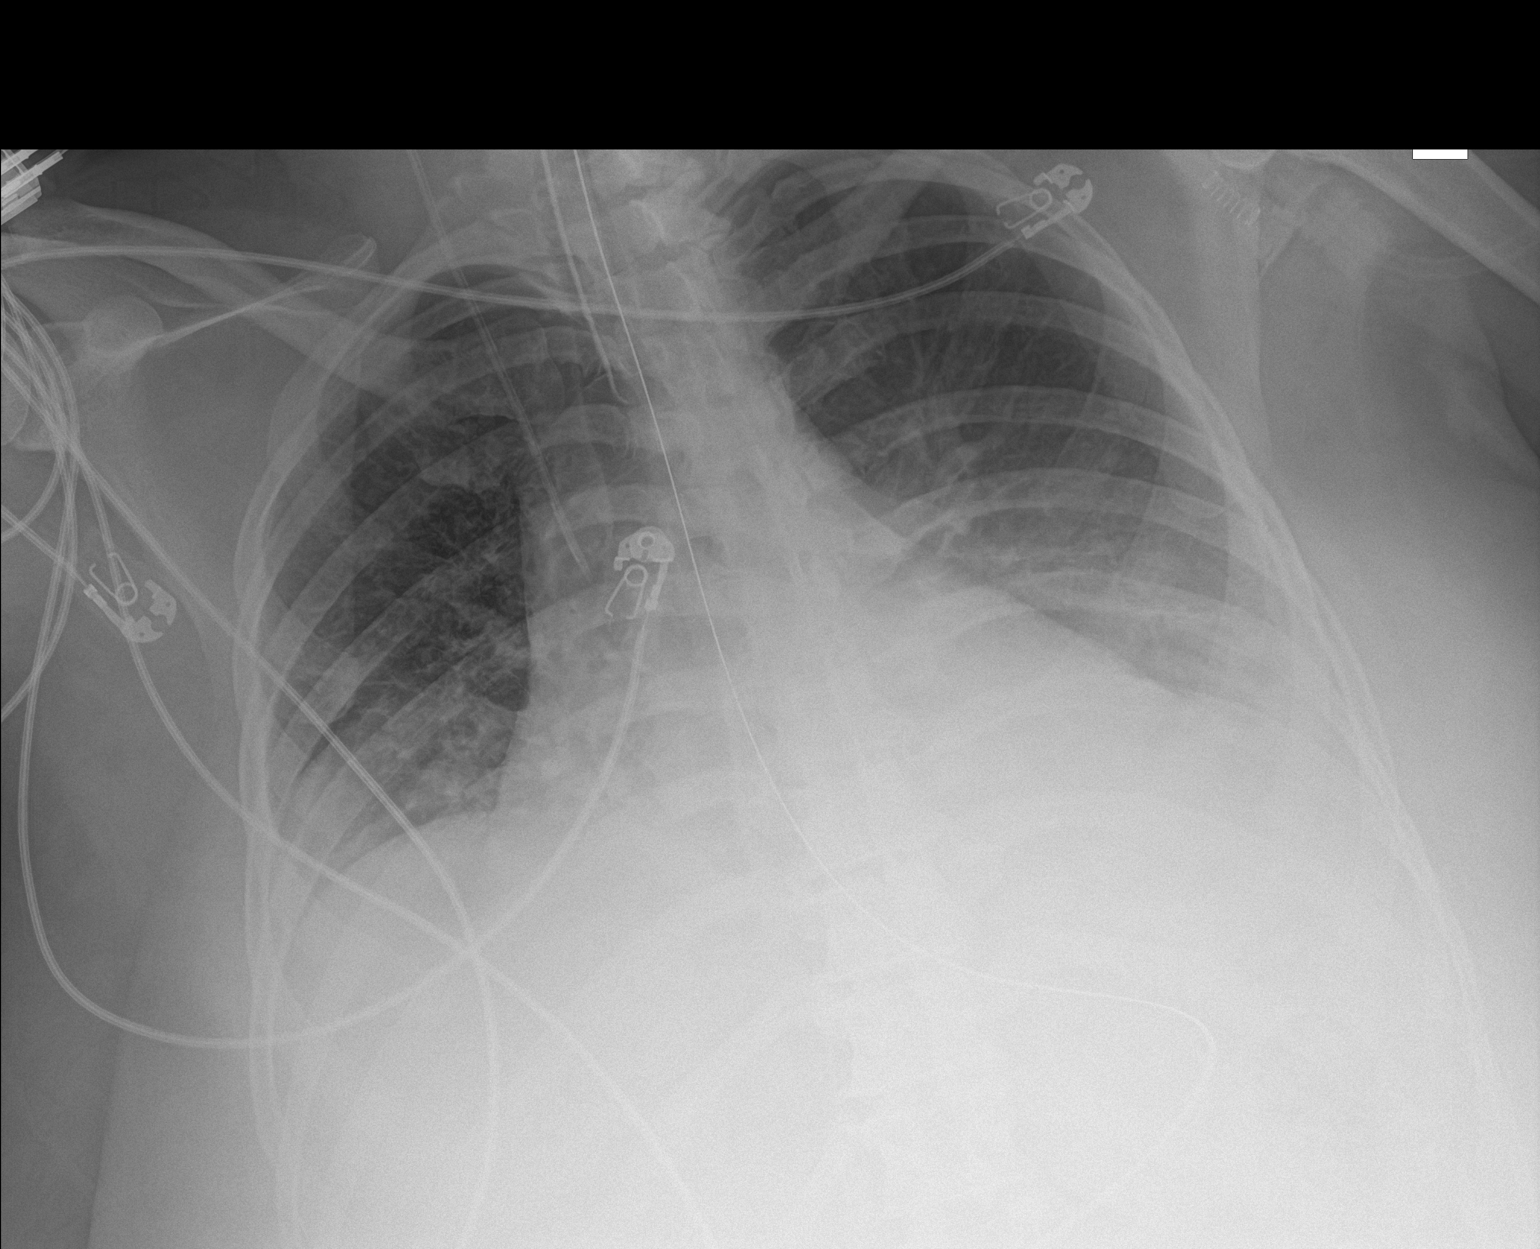

[1 of 1 positions shown; findings below may reference images not displayed]

FINDINGS: An endotracheal tube terminates in the midthoracic trachea. An
enteric tube enters the stomach and terminates below the field of
view. A right internal jugular central venous catheter tip overlies
the superior vena cava.

The heart remains enlarged. The left hemidiaphragm remains obscured
which may reflect a pleural effusion with associated
atelectasis/airspace disease. There is unchanged mild right basilar
atelectasis/airspace disease. There is no right pleural effusion.
There is no pneumothorax.
IMPRESSION: 1. Persistent left pleural effusion with associated
atelectasis/airspace disease.
2. Unchanged mild right basilar atelectasis/airspace disease.

ADDENDUM:
In the FINDINGS section, the report states that "the heart remains
enlarged." Accounting for technique, the heart is likely normal in
size and is not significantly changed since prior exam.

The IMPRESSION remains unchanged.

*** End of Addendum ***
FINDINGS: An endotracheal tube terminates in the midthoracic trachea. An
enteric tube enters the stomach and terminates below the field of
view. A right internal jugular central venous catheter tip overlies
the superior vena cava.

The heart remains enlarged. The left hemidiaphragm remains obscured
which may reflect a pleural effusion with associated
atelectasis/airspace disease. There is unchanged mild right basilar
atelectasis/airspace disease. There is no right pleural effusion.
There is no pneumothorax.
IMPRESSION: 1. Persistent left pleural effusion with associated
atelectasis/airspace disease.
2. Unchanged mild right basilar atelectasis/airspace disease.
